# Patient Record
Sex: Male | Born: 1965 | Hispanic: No | Marital: Married | Smoking: Former smoker
Health system: Southern US, Community
[De-identification: ages and names within clinical notes are randomized; demographics above are authoritative.]

## PROBLEM LIST (undated history)

## (undated) DIAGNOSIS — J302 Other seasonal allergic rhinitis: Secondary | ICD-10-CM

## (undated) HISTORY — PX: ETHMOIDECTOMY: SHX5197

## (undated) HISTORY — DX: Other seasonal allergic rhinitis: J30.2

---

## 2010-03-07 ENCOUNTER — Ambulatory Visit: Payer: Self-pay | Admitting: Family Medicine

## 2010-09-05 NOTE — Assessment & Plan Note (Signed)
Summary: BACK PAIN/WB  History of Present Illness History of Present Illness:  Subjective:  Patient was lifting/carrying a heavy piece of furniture 4 days ago when he slipped and lost control of the piece.  He did not fall.  He felt sudden pain in his mid-back and left shoulder blade area.  He has also had vague discomfort in the anterior thighs of both legs.  His pain is worse with movement and awakens him at night.  No radicular symptoms.  No bowel or bladder dysfunction.  He has a history of occasional lower back spasm that resolves spontaneously.     Objective:  Appearance:  Patient appears healthy, stated age, and in no acute distress  Neck:  Supple.  No adenopathy is present.  Mild tenderness over trapezius muscles. Lungs:  Clear to auscultation.  Breath sounds are equal.  Upper back:  Tenderness over medial edge of left scapula Lower Back:  Full range of motion.   Tenderness in bilateral paraspinous muscles from L4 to Sacral area.  Straight leg raising test is negative.  Sitting knee extension test is negative.  Strength and sensation in the lower extremities is normal.  Patellar and achilles reflexes are normal.  Heart:  Regular rate and rhythm without murmurs, rubs, or gallops.  Abdomen:  Nontender without masses or hepatosplenomegaly.  Bowel sounds are present.  No CVA or flank tenderness.  Lower extremities:  No tenderness to palpation Assessment New Problems: BACK PAIN, THORACIC REGION, LEFT (ICD-724.1) LOW BACK PAIN, ACUTE (ICD-724.2)  SUSPECT RHOMBOID STRAIN, LOW BACK STRAIN.  Plan New Medications/Changes: TRAMADOL HCL 50 MG TABS (TRAMADOL HCL) One or two tabs by mouth hs as needed for pain  #20 x 0, 03/07/2010, Donna Christen MD NAPROXEN 500 MG TABS (NAPROXEN) One by mouth two times a day pc  #20 x 0, 03/07/2010, Donna Christen MD  New Orders: New Patient Level III [99203] Ketorolac-Toradol 15mg  [P3295] Planning Comments:   Toradol 60mg  IM Begin Naproxen.  Tramadol at  bedtime as needed.  Apply ice packs.  Begin exercises ([employer] information and instruction patient handout given)  Follow-up with PCP if not improving 7 to 10 days.   The patient and/or caregiver has been counseled thoroughly with regard to medications prescribed including dosage, schedule, interactions, rationale for use, and possible side effects and they verbalize understanding.  Diagnoses and expected course of recovery discussed and will return if not improved as expected or if the condition worsens. Patient and/or caregiver verbalized understanding.  Prescriptions: TRAMADOL HCL 50 MG TABS (TRAMADOL HCL) One or two tabs by mouth hs as needed for pain  #20 x 0   Entered and Authorized by:   Donna Christen MD   Signed by:   Donna Christen MD on 03/07/2010   Method used:   Telephoned to ...       [address] [phone]* (retail)       [address] [place], [place]  [address]       Ph: [phone]       Fax: [phone]   RxID:   [id number] NAPROXEN 500 MG TABS (NAPROXEN) One by mouth two times a day pc  #20 x 0   Entered and Authorized by:   Donna Christen MD   Signed by:   Donna Christen MD on 03/07/2010   Method used:   Telephoned to ...       [facility] [address] [phone]* (retail)  [address], [place]  [address]       Ph: [phone]       Fax: [phone]   RxID:   [id number]   Orders Added: 1)  New Patient Level III [84132] 2)  Ketorolac-Toradol 15mg  [G4010]

## 2010-10-27 ENCOUNTER — Encounter: Payer: Self-pay | Admitting: Family Medicine

## 2010-10-27 ENCOUNTER — Inpatient Hospital Stay (INDEPENDENT_AMBULATORY_CARE_PROVIDER_SITE_OTHER)
Admission: RE | Admit: 2010-10-27 | Discharge: 2010-10-27 | Disposition: A | Discharge status: Home / Self Care | Payer: Self-pay | Source: Ambulatory Visit | Attending: Family Medicine | Admitting: Family Medicine

## 2010-10-27 ENCOUNTER — Ambulatory Visit
Admission: RE | Admit: 2010-10-27 | Discharge: 2010-10-27 | Disposition: A | Discharge status: Home / Self Care | Payer: Self-pay | Source: Ambulatory Visit | Attending: Family Medicine | Admitting: Family Medicine

## 2010-10-27 ENCOUNTER — Other Ambulatory Visit: Payer: Self-pay | Admitting: Family Medicine

## 2010-10-27 DIAGNOSIS — S336XXA Sprain of sacroiliac joint, initial encounter: Secondary | ICD-10-CM

## 2010-10-27 DIAGNOSIS — M2170 Unequal limb length (acquired), unspecified site: Secondary | ICD-10-CM

## 2010-10-27 DIAGNOSIS — M545 Low back pain: Secondary | ICD-10-CM

## 2010-10-27 DIAGNOSIS — M546 Pain in thoracic spine: Secondary | ICD-10-CM

## 2010-11-07 NOTE — Assessment & Plan Note (Signed)
Summary: BACK PAIN/TJ Rm 4   Vital Signs:  Patient Profile:   45 Years Old Male CC:      Lower back pain and hip x 3 weeks Height:     72 inches Weight:      190 pounds O2 Sat:      95 % O2 treatment:    Room Air Temp:     99.4 degrees F oral Pulse rate:   85 / minute Pulse rhythm:   regular Resp:     16 per minute BP sitting:   109 / 77  (left arm) Cuff size:   large  Vitals Entered By: Emilio Math (October 27, 2010 6:59 PM)                History of Present Illness Chief Complaint: Lower back pain and hip x 3 weeks History of Present Illness:  Subjective:  Patient complains of gradual development of pain in his left thigh area, worse with walking, climbing stairs.  The pain does not radiate.  He recalls no trauma, but he does practice volleyball regularly with his daughter who competes in volleyball.  No bowel/bladder dysfunction.  No saddle numbness.  No weight loss.  No fevers, chills, and sweats.  He feels well otherwise.   No rash.  REVIEW OF SYSTEMS Constitutional Symptoms      Denies fever, chills, night sweats, weight loss, weight gain, and fatigue.  Eyes       Denies change in vision, eye pain, eye discharge, glasses, contact lenses, and eye surgery. Ear/Nose/Throat/Mouth       Denies hearing loss/aids, change in hearing, ear pain, ear discharge, dizziness, frequent runny nose, frequent nose bleeds, sinus problems, sore throat, hoarseness, and tooth pain or bleeding.  Respiratory       Denies dry cough, productive cough, wheezing, shortness of breath, asthma, bronchitis, and emphysema/COPD.  Cardiovascular       Denies murmurs, chest pain, and tires easily with exhertion.    Gastrointestinal       Denies stomach pain, nausea/vomiting, diarrhea, constipation, blood in bowel movements, and indigestion. Genitourniary       Denies painful urination, kidney stones, and loss of urinary control. Neurological       Denies paralysis, seizures, and  fainting/blackouts. Musculoskeletal       Complains of joint stiffness and decreased range of motion.      Denies muscle pain, joint pain, redness, swelling, muscle weakness, and gout.  Skin       Denies bruising, unusual mles/lumps or sores, and hair/skin or nail changes.  Psych       Denies mood changes, temper/anger issues, anxiety/stress, speech problems, depression, and sleep problems.  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  Extraocular movement is intact.  Conjunctivae are not inflamed.  Neck:  Supple.  No adenopathy is present.  No tenderness Lungs:  Clear to auscultation.  Breath sounds are equal.  Heart:  Regular rate and rhythm without murmurs, rubs, or gallops.  Abdomen:  Nontender without masses or hepatosplenomegaly.  Bowel sounds are present.  No CVA or flank tenderness.   Back:    Can heel/toe walk and squat  without difficulty.  Relatively good range of motion.  There is tenderness over both SI joints, worse on the left.   Straight leg raising test is negative.  Sitting knee extension test is negative.  Strength and sensation in the lower extremities is normal.  Patellar and achilles reflexes are normal.  Skin:  No rash Extremities:  No edema.  The right leg is approximately 1.5cm to 2cm longer than the left leg by inspection X-ray LS spine:   IMPRESSION: Negative exam.    Assessment New Problems: UNEQUAL LEG LENGTH (ICD-736.81) SACROILIAC STRAIN, ACUTE (ICD-846.9) LOW BACK PAIN, ACUTE (ICD-724.2)  SUSPECT SI JOINT INFLAMMATION AS A RESULT OF LEG LENGTH DISCREPANCY  Plan New Medications/Changes: LORTAB 5 5-500 MG TABS  (HYDROCODONE-ACETAMINOPHEN) One or two tabs by mouth hs as needed pain  #12 (twelve) x 0, 10/27/2010, Donna Christen MD NAPROXEN 500 MG TABS (NAPROXEN) One by mouth two times a day pc  #20 x 1, 10/27/2010, Donna Christen MD  New Orders: T-DG Lumbar Spine Complete 5 views [72110] Est. Patient Level IV [81191] Services provided After hours-Weekends-Holidays [99051] Planning Comments:   Begin Naproxen.  Apply ice pack several times daily.  Analgesic for bedtime.  Recommend obtaining shoe insert for left shoe. Begin stretching exercises for SI joints ([employer] information and instruction patient handout given)  Followup with [facility] if not improved in two weeks.  May need custom orthotic shoe insert   The patient and/or caregiver has been counseled thoroughly with regard to medications prescribed including dosage, schedule, interactions, rationale for use, and possible side effects and they verbalize understanding.  Diagnoses and expected course of recovery discussed and will return if not improved as expected or if the condition worsens. Patient and/or caregiver verbalized understanding.  Prescriptions: LORTAB 5 5-500 MG TABS (HYDROCODONE-ACETAMINOPHEN) One or two tabs by mouth hs as needed pain  #12 (twelve) x 0   Entered and Authorized by:   Donna Christen MD   Signed by:   Donna Christen MD on 10/27/2010   Method used:   Print then Give to Patient   RxID:   [id number] NAPROXEN 500 MG TABS (NAPROXEN) One by mouth two times a day pc  #20 x 1   Entered and Authorized by:   Donna Christen MD   Signed by:   Donna Christen MD on 10/27/2010   Method used:   Print then Give to Patient   RxID:   [id number]   Orders Added: 1)  T-DG Lumbar Spine Complete 5 views [72110] 2)  Est. Patient Level IV [10272] 3)  Services provided After hours-Weekends-Holidays [99051]

## 2011-09-20 ENCOUNTER — Emergency Department
Admission: EM | Admit: 2011-09-20 | Discharge: 2011-09-20 | Disposition: A | Discharge status: Home / Self Care | Payer: BC Managed Care – PPO | Source: Home / Self Care | Attending: Family Medicine | Admitting: Family Medicine

## 2011-09-20 ENCOUNTER — Encounter: Payer: Self-pay | Admitting: Emergency Medicine

## 2011-09-20 ENCOUNTER — Emergency Department
Admit: 2011-09-20 | Discharge: 2011-09-20 | Disposition: A | Discharge status: Home / Self Care | Payer: BC Managed Care – PPO | Attending: Family Medicine | Admitting: Family Medicine

## 2011-09-20 DIAGNOSIS — R079 Chest pain, unspecified: Secondary | ICD-10-CM

## 2011-09-20 DIAGNOSIS — R0781 Pleurodynia: Secondary | ICD-10-CM

## 2011-09-20 DIAGNOSIS — J9819 Other pulmonary collapse: Secondary | ICD-10-CM

## 2011-09-20 DIAGNOSIS — J9811 Atelectasis: Secondary | ICD-10-CM

## 2011-09-20 LAB — POCT CBC W AUTO DIFF (K'VILLE URGENT CARE)

## 2011-09-20 MED ORDER — PREDNISONE 10 MG PO TABS: ORAL_TABLET | ORAL | Status: DC

## 2011-09-20 MED ORDER — HYDROCODONE-ACETAMINOPHEN 5-500 MG PO TABS: 1.0000 | ORAL_TABLET | Freq: Every evening | ORAL | Status: AC | PRN

## 2011-09-20 MED ORDER — DOXYCYCLINE HYCLATE 100 MG PO TABS: 100.0000 mg | ORAL_TABLET | Freq: Two times a day (BID) | ORAL | Status: AC

## 2011-09-20 MED ORDER — CYCLOBENZAPRINE HCL 10 MG PO TABS: ORAL_TABLET | ORAL | Status: DC

## 2011-09-20 NOTE — ED Notes (Signed)
Left rib pain radiates to left shoulder x 5 days, pain is sharp in ribs, achy in shoulder

## 2011-09-20 NOTE — ED Provider Notes (Signed)
History     CSN: [id number]  Arrival date & time 09/20/11  2130   First MD Initiated Contact with Patient 09/20/11 1009      Chief Complaint  Patient presents with  . Chest Pain    (Consider location/radiation/quality/duration/timing/severity/associated sxs/prior treatment) HPI Comments: Patient states that he was sitting on couch watching TV four days ago when he developed sudden sharp pain in his left anterior inferior rib.  The pain has persisted and is worse with movement, deep inspiration, cough. He has had difficulty sleeping at night as a result.  He is now having some dull mild radiation of pain to his left neck and shoulder.  No shortness of breath but feels restriction in chest with deep inspiration.  No fevers, chills, and sweats.  No recent cough or URI.  Nonsmoker.  Patient is a 46 y.o. male presenting with chest pain. The history is provided by the patient.  Chest Pain The chest pain began 3 - 5 days ago. Chest pain occurs frequently. The chest pain is worsening. The pain is associated with breathing, coughing and lifting (movement). The severity of the pain is severe. The quality of the pain is described as sharp. The pain radiates to the left neck and left shoulder. Chest pain is worsened by certain positions and deep breathing. Pertinent negatives for primary symptoms include no shortness of breath, no cough and no wheezing. He tried nothing for the symptoms.     History reviewed. No pertinent past medical history.  Past surgical history:  none  Family history Mother:  COPD  History  Substance Use Topics  . Smoking status: Not presently; past history of smoking  . Smokeless tobacco: No  . Alcohol Use: No      Review of Systems  Constitutional: Negative.   HENT: Negative.   Eyes: Negative.   Respiratory: Positive for chest tightness. Negative for cough, shortness of breath, wheezing and stridor.   Cardiovascular: Positive for chest pain.  Gastrointestinal:  Negative.   Genitourinary: Negative.   Musculoskeletal: Negative.   Skin: Negative.   Neurological: Negative.     Allergies  Review of patient's allergies indicates no known allergies.  Home Medications   Current Outpatient Rx  Name Route Sig Dispense Refill  . ESOMEPRAZOLE MAGNESIUM 40 MG PO CPDR Oral Take 40 mg by mouth daily before breakfast.    . CYCLOBENZAPRINE HCL 10 MG PO TABS  Take one tab by mouth, one to three times daily as necessary for muscle spasm 20 tablet 0  . DOXYCYCLINE HYCLATE 100 MG PO TABS Oral Take 1 tablet (100 mg total) by mouth 2 (two) times daily. 14 tablet 0  . HYDROCODONE-ACETAMINOPHEN 5-500 MG PO TABS Oral Take 1 tablet by mouth at bedtime as needed for pain. 10 tablet 0  . PREDNISONE 10 MG PO TABS  Take 2 tabs by mouth twice daily for three days, then one tab twice daily for 2 days, then 1 daily for two days.  Take PC 18 tablet 0    BP 153/118  Pulse 96  Temp(Src) 98.8 F (37.1 C) (Oral)  Resp 18  Ht 6' (1.829 m)  Wt 220 lb (99.791 kg)  BMI 29.84 kg/m2  SpO2 96%  Physical Exam  Nursing note and vitals reviewed. Constitutional: He is oriented to person, place, and time. He appears well-developed and well-nourished. No distress.  HENT:  Head: Normocephalic and atraumatic.  Mouth/Throat: Oropharynx is clear and moist.  Eyes: Conjunctivae are normal. Pupils are equal, round,  and reactive to light.  Neck: Normal range of motion. Neck supple.  Cardiovascular: Normal rate, regular rhythm and normal heart sounds.   Pulmonary/Chest: Effort normal and breath sounds normal. No respiratory distress. He has no wheezes. He has no rales. He exhibits tenderness.  Abdominal: Soft. Bowel sounds are normal. There is no tenderness.  Musculoskeletal: He exhibits no edema and no tenderness.       Arms:      There is distinct point tenderness to palpation over left anterior inferior rib at costal margin.  No ecchymosis, swelling, or crepitance.  Lymphadenopathy:     He has no cervical adenopathy.  Neurological: He is alert and oriented to person, place, and time.  Skin: Skin is warm and dry. No rash noted.    ED Course  Procedures  none  Labs Reviewed - No data to display Dg Chest 2 View  09/20/2011  *RADIOLOGY REPORT*  Clinical Data: Pleuritic left chest pain for 4 days  CHEST - 2 VIEW  Comparison: None.  Findings: There is mild bibasilar linear atelectasis present.  The heart is within upper limits of normal.  No focal infiltrate or effusion is seen.  Mediastinal contours appear normal.  No acute bony abnormality is noted.  IMPRESSION: Mild bibasilar linear atelectasis.  Original Report Authenticated By: Juline Patch, M.D.   Dg Ribs Unilateral Left  09/20/2011  *RADIOLOGY REPORT*  Clinical Data: Pleuritic left chest pain for 4 days  LEFT RIBS - 2 VIEW  Comparison: None.  Findings: Left lower rib detail films were obtained with a small metallic marker placed over the area of pain.  No acute left rib fracture is seen.  IMPRESSION: Negative left rib detail.  Original Report Authenticated By: Juline Patch, M.D.   EKG within normal limits.  1. Rib pain on left side   2. Linear atelectasis       MDM  Begin tapering course of prednisone.  Empirically begin doxycyline for one week. Lortab at bedtime.  Suspect pain in left shoulder and neck is because of muscle splinting from chest pain:  Begin Flexeril. Apply heating pad to chest two or three times daily. Bibasilar linear atelectasis may be completely coincidental; however, will arrange pulmonologist referral in about a week.         Donna Christen, MD 09/20/11 1146

## 2011-09-20 NOTE — Discharge Instructions (Signed)
Apply heating pad to chest two or three times daily.

## 2011-09-27 ENCOUNTER — Ambulatory Visit (INDEPENDENT_AMBULATORY_CARE_PROVIDER_SITE_OTHER): Payer: BC Managed Care – PPO | Admitting: Internal Medicine

## 2011-09-27 ENCOUNTER — Other Ambulatory Visit: Payer: Self-pay | Admitting: Internal Medicine

## 2011-09-27 ENCOUNTER — Encounter: Payer: Self-pay | Admitting: Internal Medicine

## 2011-09-27 DIAGNOSIS — J9811 Atelectasis: Secondary | ICD-10-CM

## 2011-09-27 DIAGNOSIS — R0789 Other chest pain: Secondary | ICD-10-CM

## 2011-09-27 DIAGNOSIS — R071 Chest pain on breathing: Secondary | ICD-10-CM

## 2011-09-27 DIAGNOSIS — Z0000 Encounter for general adult medical examination without abnormal findings: Secondary | ICD-10-CM

## 2011-09-27 DIAGNOSIS — J9819 Other pulmonary collapse: Secondary | ICD-10-CM

## 2011-09-27 LAB — HEPATIC FUNCTION PANEL
ALT: 48 U/L (ref 0–53)
Albumin: 4.5 g/dL (ref 3.5–5.2)
Indirect Bilirubin: 0.5 mg/dL (ref 0.0–0.9)
Total Protein: 6.8 g/dL (ref 6.0–8.3)

## 2011-09-27 LAB — BASIC METABOLIC PANEL
CO2: 25 mEq/L (ref 19–32)
Chloride: 106 mEq/L (ref 96–112)
Creat: 0.77 mg/dL (ref 0.50–1.35)
Potassium: 4.7 mEq/L (ref 3.5–5.3)

## 2011-09-27 LAB — CBC
Hemoglobin: 16.2 g/dL (ref 13.0–17.0)
MCH: 30.5 pg (ref 26.0–34.0)
MCHC: 33.6 g/dL (ref 30.0–36.0)
MCV: 90.8 fL (ref 78.0–100.0)
RBC: 5.31 MIL/uL (ref 4.22–5.81)

## 2011-09-27 LAB — LIPID PANEL
Cholesterol: 171 mg/dL (ref 0–200)
HDL: 52 mg/dL (ref >39)
LDL Cholesterol: 106 mg/dL — ABNORMAL HIGH (ref 0–99)
Total CHOL/HDL Ratio: 3.3 Ratio
Triglycerides: 63 mg/dL (ref <150)
VLDL: 13 mg/dL (ref 0–40)

## 2011-09-27 MED ORDER — HYDROCODONE-ACETAMINOPHEN 5-500 MG PO TABS: 1.0000 | ORAL_TABLET | Freq: Three times a day (TID) | ORAL | Status: AC | PRN

## 2011-09-27 NOTE — Progress Notes (Signed)
  Subjective:    Patient ID: Antonio Calhoun, male    DOB: 12/28/1965, 46 y.o.   MRN: [id number]  HPI Pt presents to clinic for follow up of abnormal cxr. Recently seen at [facility] with left ant CW pain worsened by movement and was reproducible to palpation. No preceeding injury or trauma. cxr was obtained with finding of mild bibasilar linear atelectasis without rib or other abnormality. Has had no cough, fever, chills or recent illness. Placed on course of doxycycline x 7d, prednisone, hydrocodone and flexeril. Feels 50% better. Was told needed pulmonary consult for atelectasis. States at the time was not taking deep breaths due to pain. BP mildly elevated with h/o HTN in the past which he believes was more work stress related. No other alleviating or exacerbating factors. No other complaints.   Past Medical History  Diagnosis Date  . Asthma     childhood  . Seasonal allergies    Past Surgical History  Procedure Date  . Ethmoidectomy     age 41    reports that he has quit smoking. He has never used smokeless tobacco. He reports that he drinks alcohol. He reports that he does not use illicit drugs. family history includes Alcohol abuse in his father; Arthritis in his paternal grandfather; Heart attack in his father; and Stroke in his paternal grandfather.  There is no history of Breast cancer, and Colon polyps, and Heart disease, and Colon cancer, and Hypertension, and Hyperlipidemia, . No Known Allergies   Review of Systems  Constitutional: Negative for fever, chills and fatigue.  Respiratory: Negative for cough, shortness of breath and wheezing.   Cardiovascular: Negative for chest pain and palpitations.  All other systems reviewed and are negative.       Objective:   Physical Exam  Physical Exam  Nursing note and vitals reviewed. Constitutional: Appears well-developed and well-nourished. No distress.  HENT:  Head: Normocephalic and atraumatic.  Right Ear: External ear normal.  Left  Ear: External ear normal.  Eyes: Conjunctivae are normal. No scleral icterus.  Neck: Neck supple. Carotid bruit is not present.  Cardiovascular: Normal rate, regular rhythm and normal heart sounds.  Exam reveals no gallop and no friction rub.   No murmur heard. Pulmonary/Chest: Effort normal and breath sounds normal. No respiratory distress. He has no wheezes. no rales.  Lymphadenopathy:    He has no cervical adenopathy.  Neurological:Alert.  Skin: Skin is warm and dry. Not diaphoretic.  Psychiatric: Has a normal mood and affect.        Assessment & Plan:

## 2011-09-28 LAB — HEMOGLOBIN A1C: Hgb A1c MFr Bld: 5.6 % (ref <5.7)

## 2011-09-30 ENCOUNTER — Encounter: Payer: Self-pay | Admitting: Internal Medicine

## 2011-09-30 DIAGNOSIS — R0789 Other chest pain: Secondary | ICD-10-CM | POA: Insufficient documentation

## 2011-09-30 DIAGNOSIS — J9811 Atelectasis: Secondary | ICD-10-CM | POA: Insufficient documentation

## 2011-09-30 NOTE — Assessment & Plan Note (Signed)
Reviewed with pt definition of atelectasis. No clinical evidence to support pneumonia. Schedule 2wk f/u and consider repeat cxr. May want cpe at that time per pt

## 2011-09-30 NOTE — Assessment & Plan Note (Signed)
Clinically c/w msk etiology. rf vicodin for prn use. Use nsaid after prednisone completion. Close f/u scheduled.

## 2011-10-10 ENCOUNTER — Encounter: Payer: Self-pay | Admitting: Internal Medicine

## 2011-10-10 ENCOUNTER — Ambulatory Visit (INDEPENDENT_AMBULATORY_CARE_PROVIDER_SITE_OTHER): Payer: BC Managed Care – PPO | Admitting: Internal Medicine

## 2011-10-10 VITALS — BP 124/90 | HR 85 | Temp 97.5°F | Resp 18 | Ht 70.75 in | Wt 221.0 lb

## 2011-10-10 DIAGNOSIS — IMO0001 Reserved for inherently not codable concepts without codable children: Secondary | ICD-10-CM

## 2011-10-10 DIAGNOSIS — Z0000 Encounter for general adult medical examination without abnormal findings: Secondary | ICD-10-CM

## 2011-10-10 DIAGNOSIS — Z125 Encounter for screening for malignant neoplasm of prostate: Secondary | ICD-10-CM

## 2011-10-10 DIAGNOSIS — J9819 Other pulmonary collapse: Secondary | ICD-10-CM

## 2011-10-10 DIAGNOSIS — R5381 Other malaise: Secondary | ICD-10-CM

## 2011-10-10 DIAGNOSIS — J9811 Atelectasis: Secondary | ICD-10-CM

## 2011-10-10 DIAGNOSIS — R030 Elevated blood-pressure reading, without diagnosis of hypertension: Secondary | ICD-10-CM

## 2011-10-10 DIAGNOSIS — R5383 Other fatigue: Secondary | ICD-10-CM

## 2011-10-10 DIAGNOSIS — D72829 Elevated white blood cell count, unspecified: Secondary | ICD-10-CM

## 2011-10-10 NOTE — Patient Instructions (Signed)
Please schedule chest xray and labs for this Friday morning Cbc-leukocytosis, psa-prostate cancer screening, testosterone-fatigue

## 2011-10-10 NOTE — Assessment & Plan Note (Signed)
Asx. Obtain f/u cxr

## 2011-10-10 NOTE — Assessment & Plan Note (Signed)
Asx. Pursue low sodium diet and regular cardio exercise. Monitor bp as outpt and f/u in clinic as scheduled.

## 2011-10-10 NOTE — Progress Notes (Signed)
  Subjective:    Patient ID: Antonio Calhoun, male    DOB: [birth year], 46 y.o.   MRN: [id number]  HPI Pt presents to clinic for annual exam. Recent left ant chest wall pain has resolved. Reviewed h/o mildly elevated bp. C/o fatigue and decreased libido.  Past Medical History  Diagnosis Date  . Asthma     childhood  . Seasonal allergies    Past Surgical History  Procedure Date  . Ethmoidectomy     age 89    reports that he has quit smoking. He has never used smokeless tobacco. He reports that he drinks alcohol. He reports that he does not use illicit drugs. family history includes Alcohol abuse in his father; Arthritis in his paternal grandfather; Heart attack in his father; and Stroke in his paternal grandfather.  There is no history of Breast cancer, and Colon polyps, and Heart disease, and Colon cancer, and Hypertension, and Hyperlipidemia, . No Known Allergies   Review of Systems see hpi     Objective:   Physical Exam  Physical Exam  Nursing note and vitals reviewed. Constitutional: He appears well-developed and well-nourished. No distress.  HENT:  Head: Normocephalic and atraumatic.  Right Ear: Tympanic membrane and external ear normal.  Left Ear: Tympanic membrane and external ear normal.  Nose: Nose normal.  Mouth/Throat: Uvula is midline, oropharynx is clear and moist and mucous membranes are normal. No oropharyngeal exudate.  Eyes: Conjunctivae and EOM are normal. Pupils are equal, round, and reactive to light. Right eye exhibits no discharge. Left eye exhibits no discharge. No scleral icterus.  Neck: Neck supple. Carotid bruit is not present. No thyromegaly present.  Cardiovascular: Normal rate, regular rhythm and normal heart sounds.  Exam reveals no gallop and no friction rub.   No murmur heard. Pulmonary/Chest: Effort normal and breath sounds normal. No respiratory distress. He has no wheezes. He has no rales.  Abdominal: Soft. He exhibits no distension and no mass.  There is no hepatosplenomegaly. There is no tenderness. There is no rebound. Hernia confirmed negative in the right inguinal area and confirmed negative in the left inguinal area.  Genitourinary:Right testis shows no mass, no swelling and no tenderness. Right testis is descended. Left testis shows no mass, no swelling and no tenderness. Left testis is descended.  Lymphadenopathy:    He has no cervical adenopathy.       Right: No inguinal adenopathy present.       Left: No inguinal adenopathy present.  Neurological: He is alert.  Skin: Skin is warm and dry. He is not diaphoretic.  Psychiatric: He has a normal mood and affect.        Assessment & Plan:

## 2011-10-10 NOTE — Assessment & Plan Note (Signed)
Nl exam. Given c/o fatige and decreased libido obtain early am testosterone. Repeat cbc due to mild asx leukocytosis

## 2011-10-12 ENCOUNTER — Ambulatory Visit (HOSPITAL_BASED_OUTPATIENT_CLINIC_OR_DEPARTMENT_OTHER)
Admission: RE | Admit: 2011-10-12 | Discharge: 2011-10-12 | Disposition: A | Discharge status: Home / Self Care | Payer: BC Managed Care – PPO | Source: Ambulatory Visit | Attending: Internal Medicine | Admitting: Internal Medicine

## 2011-10-12 ENCOUNTER — Other Ambulatory Visit: Payer: Self-pay | Admitting: *Deleted

## 2011-10-12 DIAGNOSIS — J9811 Atelectasis: Secondary | ICD-10-CM

## 2011-10-12 DIAGNOSIS — D72829 Elevated white blood cell count, unspecified: Secondary | ICD-10-CM

## 2011-10-12 DIAGNOSIS — R5383 Other fatigue: Secondary | ICD-10-CM

## 2011-10-12 DIAGNOSIS — Z09 Encounter for follow-up examination after completed treatment for conditions other than malignant neoplasm: Secondary | ICD-10-CM | POA: Insufficient documentation

## 2011-10-12 DIAGNOSIS — Z125 Encounter for screening for malignant neoplasm of prostate: Secondary | ICD-10-CM

## 2011-10-12 DIAGNOSIS — J9819 Other pulmonary collapse: Secondary | ICD-10-CM

## 2011-10-12 LAB — CBC
Hemoglobin: 15.9 g/dL (ref 13.0–17.0)
MCH: 30.5 pg (ref 26.0–34.0)
Platelets: 312 10*3/uL (ref 150–400)
RBC: 5.21 MIL/uL (ref 4.22–5.81)
WBC: 6.2 10*3/uL (ref 4.0–10.5)

## 2011-10-13 LAB — PSA: PSA: 1.21 ng/mL (ref <=4.00)

## 2011-10-13 LAB — TESTOSTERONE: Testosterone: 248.5 ng/dL — ABNORMAL LOW (ref 250–890)

## 2011-10-15 ENCOUNTER — Other Ambulatory Visit: Payer: Self-pay | Admitting: Internal Medicine

## 2011-10-15 DIAGNOSIS — E291 Testicular hypofunction: Secondary | ICD-10-CM

## 2011-10-17 LAB — TESTOSTERONE, FREE, TOTAL, SHBG
Sex Hormone Binding: 22 nmol/L (ref 13–71)
Testosterone, Free: 53 pg/mL (ref 47.0–244.0)

## 2012-01-09 ENCOUNTER — Ambulatory Visit (INDEPENDENT_AMBULATORY_CARE_PROVIDER_SITE_OTHER): Payer: BC Managed Care – PPO | Admitting: Internal Medicine

## 2012-01-09 ENCOUNTER — Encounter: Payer: Self-pay | Admitting: Internal Medicine

## 2012-01-09 VITALS — BP 120/100 | HR 77 | Temp 98.3°F | Resp 18 | Ht 70.75 in | Wt 215.0 lb

## 2012-01-09 DIAGNOSIS — G4700 Insomnia, unspecified: Secondary | ICD-10-CM | POA: Insufficient documentation

## 2012-01-09 DIAGNOSIS — K219 Gastro-esophageal reflux disease without esophagitis: Secondary | ICD-10-CM

## 2012-01-09 DIAGNOSIS — R030 Elevated blood-pressure reading, without diagnosis of hypertension: Secondary | ICD-10-CM

## 2012-01-09 MED ORDER — PANTOPRAZOLE SODIUM 40 MG PO TBEC: 40.0000 mg | DELAYED_RELEASE_TABLET | Freq: Every day | ORAL | Status: DC

## 2012-01-09 MED ORDER — ZOLPIDEM TARTRATE 5 MG PO TABS: 5.0000 mg | ORAL_TABLET | Freq: Every evening | ORAL | Status: DC | PRN

## 2012-01-09 NOTE — Assessment & Plan Note (Signed)
Failed omeprazole. Attempt protonix
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
## 2012-01-09 NOTE — Assessment & Plan Note (Signed)
Possible contribution from stress. Recommend 2-3 wk outpt bp log to be submitted for review. S/p sodium restriction and exercise.

## 2012-01-09 NOTE — Assessment & Plan Note (Signed)
Short term use of ambien. Has paradoxical effect from [facility].

## 2012-05-20 ENCOUNTER — Other Ambulatory Visit: Payer: Self-pay | Admitting: Internal Medicine

## 2012-05-21 NOTE — Telephone Encounter (Signed)
Done/SLS 

## 2012-09-20 ENCOUNTER — Other Ambulatory Visit: Payer: Self-pay

## 2012-09-30 ENCOUNTER — Other Ambulatory Visit: Payer: Self-pay | Admitting: Internal Medicine

## 2013-01-19 ENCOUNTER — Telehealth: Payer: Self-pay | Admitting: Internal Medicine

## 2013-01-19 MED ORDER — PANTOPRAZOLE SODIUM 40 MG PO TBEC: 40.0000 mg | DELAYED_RELEASE_TABLET | Freq: Every day | ORAL | Status: DC

## 2013-01-19 NOTE — Addendum Note (Signed)
Addended by: Court Joy on: 01/19/2013 03:19 PM   Modules accepted: Orders

## 2013-01-19 NOTE — Telephone Encounter (Signed)
pantoprazole sod dr 40 mg tab qty 30 take 1 tablet 40 mg by mouth daily last fill 12-26-2012

## 2013-01-19 NOTE — Telephone Encounter (Signed)
Pt informed that I could only give 30 tabs.   Pt scheduled a physical appt for 02-16-13

## 2013-01-19 NOTE — Telephone Encounter (Signed)
RX sent

## 2013-02-16 ENCOUNTER — Ambulatory Visit (INDEPENDENT_AMBULATORY_CARE_PROVIDER_SITE_OTHER): Payer: BC Managed Care – PPO | Admitting: Family Medicine

## 2013-02-16 DIAGNOSIS — R030 Elevated blood-pressure reading, without diagnosis of hypertension: Secondary | ICD-10-CM

## 2013-02-16 DIAGNOSIS — Z0289 Encounter for other administrative examinations: Secondary | ICD-10-CM

## 2013-02-16 NOTE — Progress Notes (Signed)
Patient ID: Antonio Calhoun, male   DOB: 09/20/1965, 46 y.o.   MRN: [id number] Patient did not show for visit

## 2013-03-16 ENCOUNTER — Other Ambulatory Visit: Payer: Self-pay | Admitting: *Deleted

## 2013-03-16 MED ORDER — PANTOPRAZOLE SODIUM 40 MG PO TBEC: 40.0000 mg | DELAYED_RELEASE_TABLET | Freq: Every day | ORAL | Status: DC

## 2013-03-16 NOTE — Telephone Encounter (Signed)
Rx request to pharmacy/SLS  

## 2013-05-05 IMAGING — CR DG RIBS 2V*L*
2 series · 2 of 2 positions shown · non-contrast
Comparison: None.

CLINICAL DATA: Pleuritic left chest pain for 4 days

LEFT RIBS - 2 VIEW

[view not recorded (1 of 2)]
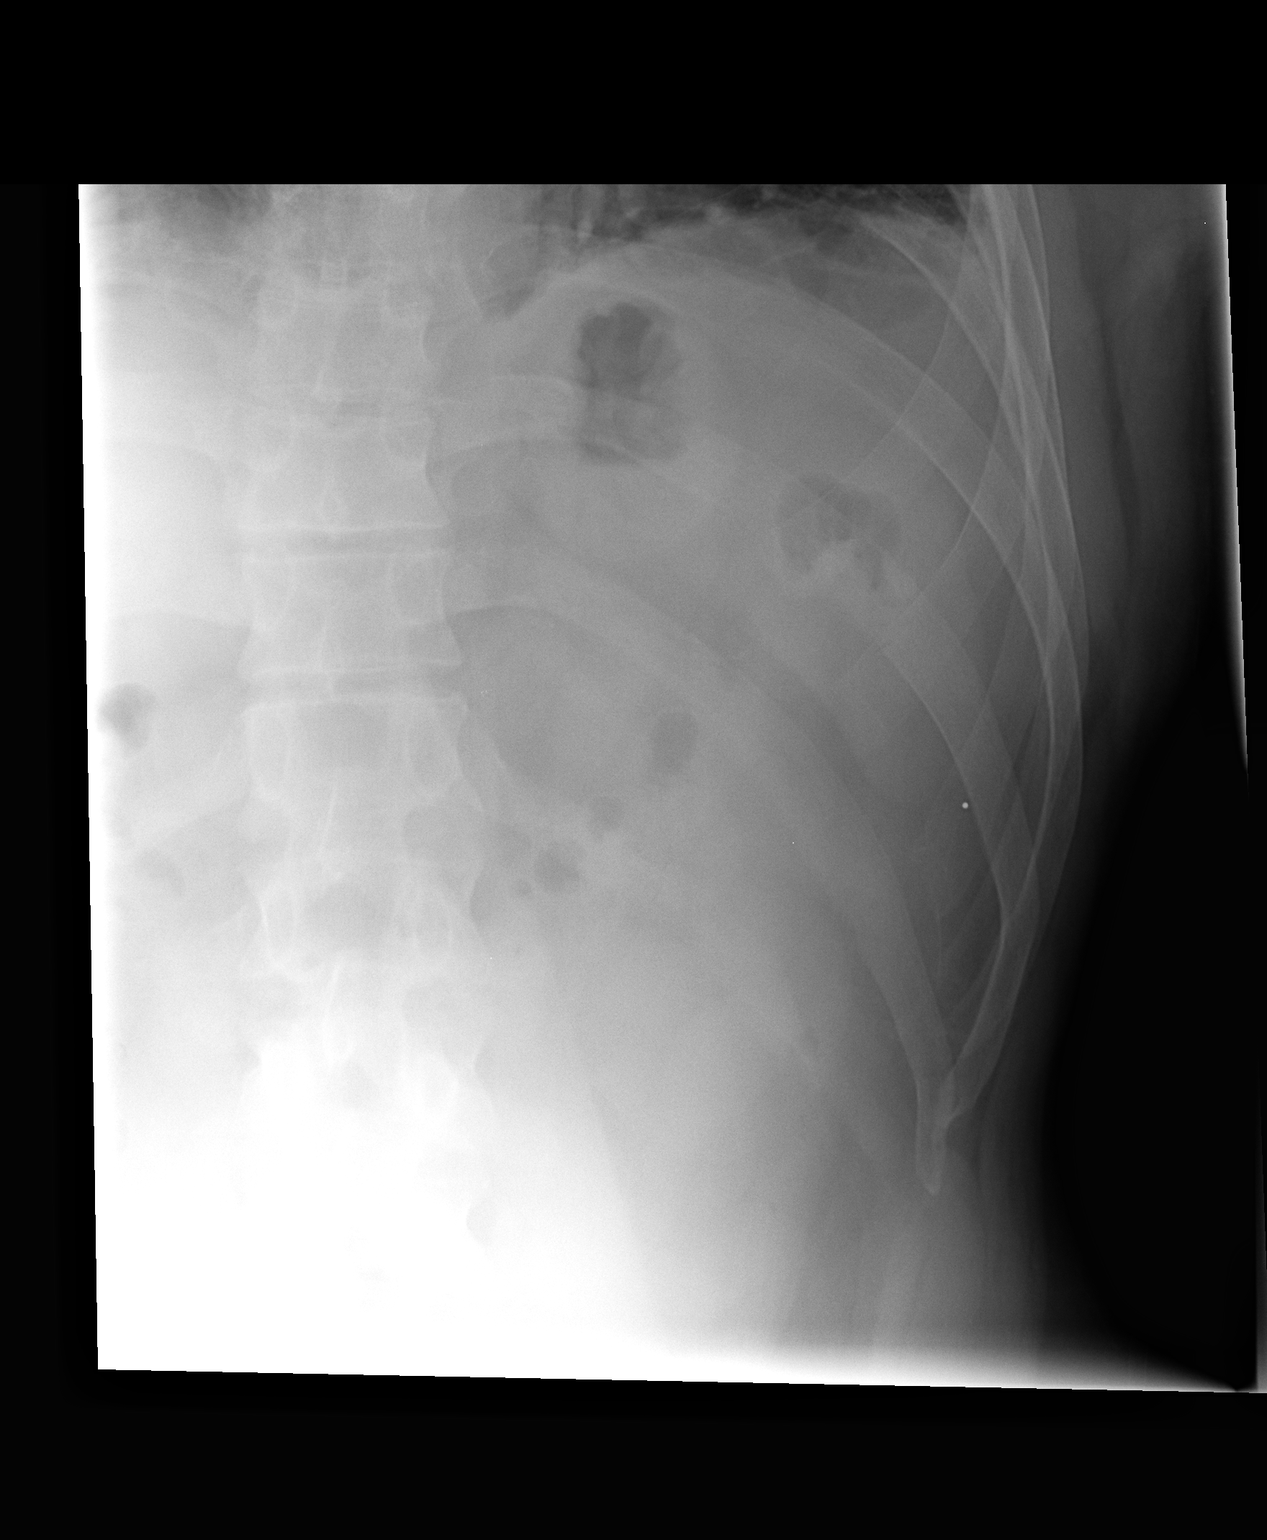

[view not recorded (2 of 2)]
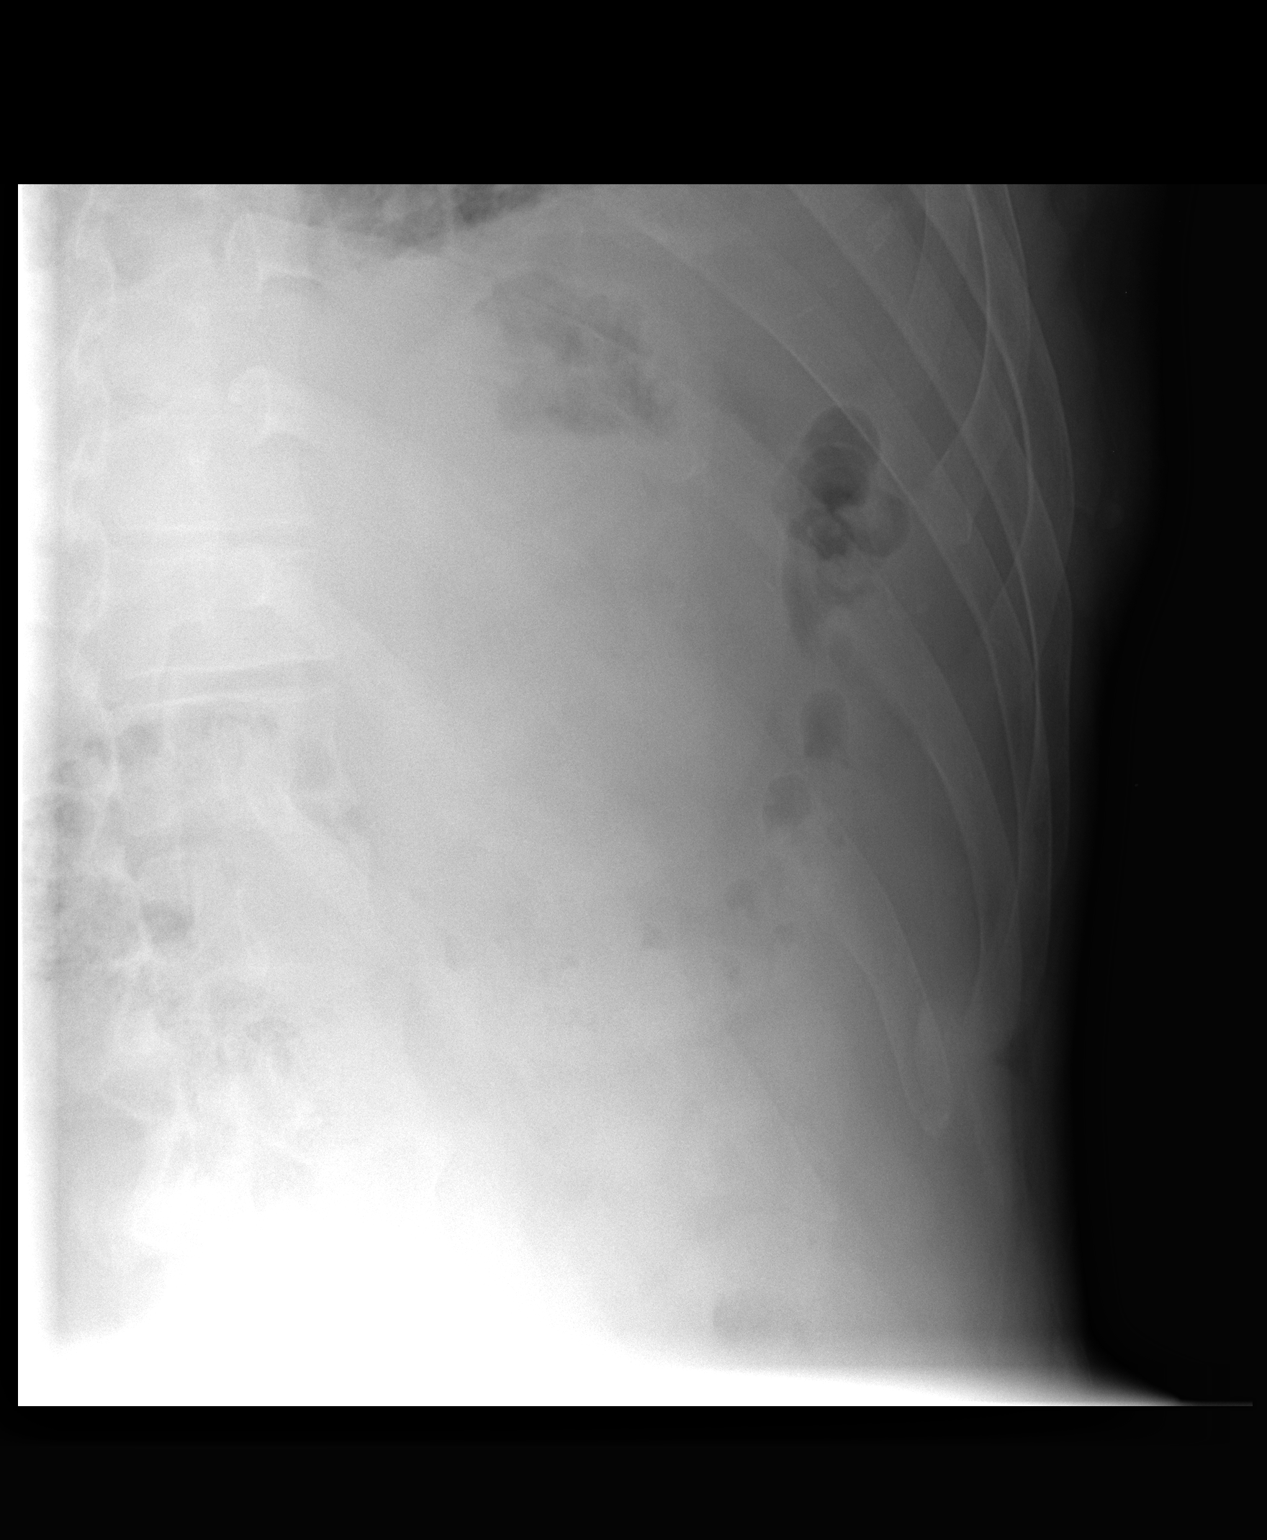

[2 of 2 positions shown; findings below may reference images not displayed]

FINDINGS: Left lower rib detail films were obtained with a small
metallic marker placed over the area of pain.  No acute left rib
fracture is seen.
IMPRESSION: Negative left rib detail.

## 2013-06-11 ENCOUNTER — Other Ambulatory Visit: Payer: Self-pay

## 2014-12-26 ENCOUNTER — Encounter: Payer: Self-pay | Admitting: Emergency Medicine

## 2014-12-26 ENCOUNTER — Emergency Department (INDEPENDENT_AMBULATORY_CARE_PROVIDER_SITE_OTHER)
Admission: EM | Admit: 2014-12-26 | Discharge: 2014-12-26 | Disposition: A | Discharge status: Home / Self Care | Payer: BLUE CROSS/BLUE SHIELD | Source: Home / Self Care | Attending: Family Medicine | Admitting: Family Medicine

## 2014-12-26 DIAGNOSIS — L259 Unspecified contact dermatitis, unspecified cause: Secondary | ICD-10-CM | POA: Diagnosis not present

## 2014-12-26 DIAGNOSIS — Z23 Encounter for immunization: Secondary | ICD-10-CM | POA: Diagnosis not present

## 2014-12-26 DIAGNOSIS — R631 Polydipsia: Secondary | ICD-10-CM | POA: Diagnosis not present

## 2014-12-26 LAB — POCT CBC W AUTO DIFF (K'VILLE URGENT CARE)

## 2014-12-26 LAB — COMPLETE METABOLIC PANEL WITH GFR
ALBUMIN: 4.4 g/dL (ref 3.5–5.2)
ALT: 24 U/L (ref 0–53)
AST: 20 U/L (ref 0–37)
Alkaline Phosphatase: 61 U/L (ref 39–117)
BILIRUBIN TOTAL: 0.7 mg/dL (ref 0.2–1.2)
BUN: 23 mg/dL (ref 6–23)
CALCIUM: 9.5 mg/dL (ref 8.4–10.5)
CHLORIDE: 107 meq/L (ref 96–112)
CO2: 26 mEq/L (ref 19–32)
CREATININE: 1.04 mg/dL (ref 0.50–1.35)
GFR, Est African American: >89 mL/min
GFR, Est Non African American: 84 mL/min
Glucose, Bld: 64 mg/dL — ABNORMAL LOW (ref 70–99)
POTASSIUM: 4.6 meq/L (ref 3.5–5.3)
SODIUM: 138 meq/L (ref 135–145)
TOTAL PROTEIN: 6.6 g/dL (ref 6.0–8.3)

## 2014-12-26 LAB — POCT URINALYSIS DIPSTICK
Bilirubin, UA: NEGATIVE
Blood, UA: NEGATIVE
GLUCOSE UA: NEGATIVE
Ketones, UA: NEGATIVE
Leukocytes, UA: NEGATIVE
NITRITE UA: NEGATIVE
PROTEIN UA: NEGATIVE
UROBILINOGEN UA: 0.2 (ref 0–1)
pH, UA: 5 (ref 5–8)

## 2014-12-26 LAB — CK: CK TOTAL: 109 U/L (ref 7–232)

## 2014-12-26 MED ORDER — TRIAMCINOLONE ACETONIDE 40 MG/ML IJ SUSP
40.0000 mg | Freq: Once | INTRAMUSCULAR | Status: AC
  Administered 2014-12-26: 40 mg via INTRAMUSCULAR

## 2014-12-26 MED ORDER — TETANUS-DIPHTH-ACELL PERTUSSIS 5-2.5-18.5 LF-MCG/0.5 IM SUSP
0.5000 mL | Freq: Once | INTRAMUSCULAR | Status: AC
  Administered 2014-12-26: 0.5 mL via INTRAMUSCULAR

## 2014-12-26 MED ORDER — DOXYCYCLINE HYCLATE 100 MG PO CAPS: 100.0000 mg | ORAL_CAPSULE | Freq: Two times a day (BID) | ORAL | Status: DC

## 2014-12-26 NOTE — Discharge Instructions (Signed)

## 2014-12-26 NOTE — ED Provider Notes (Signed)
CSN: [id number]     Arrival date & time 12/26/14  1519 History   First MD Initiated Contact with Patient 12/26/14 1544     Chief Complaint  Patient presents with  . 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  The next day he noticed a pruritic rash on his lower legs that has persisted.  He states that he always drinks water regularly, but during the past six days he has had increased thirst that even awakens him.  He has also noticed a recurring sensation that his muscles are contracting even when he is relaxed (not muscle cramps or pain).  No fevers, chills, and sweats.  He feels well otherwise.  Patient is a 49 y.o. male presenting with rash. The history is provided by the patient.  Rash Location: lower legs. Quality: itchiness   Quality: not painful   Severity:  Mild Onset quality:  Sudden Duration:  6 days Timing:  Constant Progression:  Worsening Chronicity:  New Context: plant contact   Context: not animal contact, not chemical exposure, not food, not hot tub use, not insect bite/sting, not medications, not new detergent/soap and not sick contacts   Relieved by:  Nothing Worsened by:  Nothing tried Ineffective treatments:  Topical steroids Associated symptoms: myalgias   Associated symptoms: no abdominal pain, no diarrhea, no fatigue, no fever, no headaches, no induration, no joint pain, no nausea, no shortness of breath, no sore throat and no throat swelling     Past Medical History  Diagnosis Date  . Asthma     childhood  . Seasonal allergies    Past Surgical History  Procedure Laterality Date  . Ethmoidectomy      age 36   Family History  Problem Relation Age of Onset  . Alcohol abuse Father   . Arthritis Paternal Grandfather   . Stroke Paternal Grandfather   . Breast cancer Neg Hx   . Colon polyps Neg Hx   . Heart disease Neg Hx   . Heart attack Father     deceased age 56  . Colon  cancer Neg Hx   . Hypertension Neg Hx   . Hyperlipidemia Neg Hx    History  Substance Use Topics  . Smoking status: Former Games developer  . Smokeless tobacco: Never Used     Comment: quit 2006 history of 1/2 ppd for 6 years  . Alcohol Use: Yes     Comment: rare once a month    Review of Systems  Constitutional: Negative for fever, chills, diaphoresis, activity change, appetite change, fatigue and unexpected weight change.  HENT: Negative.  Negative for sore throat.   Eyes: Negative.   Respiratory: Negative for shortness of breath.   Cardiovascular: Negative.   Gastrointestinal: Negative for nausea, abdominal pain and diarrhea.  Endocrine: Positive for polydipsia. Negative for polyphagia and polyuria.  Genitourinary: Negative.   Musculoskeletal: Positive for myalgias. Negative for back pain, joint swelling and arthralgias.  Skin: Positive for rash.  Neurological: Negative for headaches.    Allergies  Review of patient's allergies indicates no known allergies.  Home Medications   Prior to Admission medications   Medication Sig Start Date End Date Taking? Authorizing Provider  doxycycline (VIBRAMYCIN) 100 MG capsule Take 1 capsule (100 mg total) by mouth 2 (two) times daily. Take with food. 12/26/14   Lattie Haw, MD  pantoprazole (PROTONIX) 40 MG tablet Take 1 tablet (40 mg  total) by mouth daily. 03/16/13   Bradd CanaryStacey A Blyth, MD  zolpidem (AMBIEN) 5 MG tablet Take 1 tablet (5 mg total) by mouth at bedtime as needed for sleep. 01/09/12 01/08/13  Edwyna Perfecthomas W Hodgin, MD   BP 121/76 mmHg  Pulse 94  Temp(Src) 98.1 F (36.7 C) (Oral)  Resp 18  Ht 5\' 11"  (1.803 m)  Wt 175 lb (79.379 kg)  BMI 24.42 kg/m2  SpO2 96% Physical Exam  Constitutional: He is oriented to person, place, and time. He appears well-developed and well-nourished. No distress.  HENT:  Head: Normocephalic.  Mouth/Throat: Oropharynx is clear and moist.  Eyes: Conjunctivae are normal. Pupils are equal, round, and reactive to  light.  Neck: Neck supple.  Cardiovascular: Normal heart sounds.   Pulmonary/Chest: Breath sounds normal.  Abdominal: He exhibits no mass. There is no tenderness.  Musculoskeletal: He exhibits no edema.  Lymphadenopathy:    He has no cervical adenopathy.  Neurological: He is alert and oriented to person, place, and time.  Skin: Skin is warm and dry. Rash noted. Rash is macular.     Lower legs below the knee have scattered small erythematous patches and excoriations that suggest a contact dermatitis.  On the left pre-tibial area as noted on diagram is a 4cm diameter nummular lesion, more erythematous than other lesions but not indurated or tender to palpation.     Nursing note and vitals reviewed.   ED Course  Procedures  None    Labs Reviewed  CK  COMPLETE METABOLIC PANEL WITH GFR  POCT CBC W AUTO DIFF ([facility]):  WBC 11.9; LY 16.9; MO 5.3; GR 77.8; Hgb 16.0; Platelets 289   POCT URINALYSIS DIPSTICK:  SG >= 1.030; otherwise negative      MDM   1. Contact dermatitis; ?secondary cellulitis   2. Polydipsia ?etiology.  Note leukocytosis 11.9    Kenalog 40mg  IM.  Begin doxycycline for staph coverage. Check electrolytes and renal function with CMP.  CK pending. Followup with Family Doctor     Lattie HawStephen A Beese, MD 12/28/14 [phone]

## 2014-12-26 NOTE — ED Notes (Signed)
Worked in yard 7 days ago; began noticing rash on lower extremities progressing; very red circular rash area around break in skin on left shin. No knowledge of Tetanus immunization status.

## 2016-06-22 ENCOUNTER — Encounter: Payer: Self-pay | Admitting: *Deleted

## 2016-06-22 ENCOUNTER — Emergency Department (INDEPENDENT_AMBULATORY_CARE_PROVIDER_SITE_OTHER): Payer: Managed Care, Other (non HMO)

## 2016-06-22 ENCOUNTER — Emergency Department
Admission: EM | Admit: 2016-06-22 | Discharge: 2016-06-22 | Disposition: A | Discharge status: Home / Self Care | Payer: Managed Care, Other (non HMO) | Source: Home / Self Care | Attending: Family Medicine | Admitting: Family Medicine

## 2016-06-22 DIAGNOSIS — R1031 Right lower quadrant pain: Secondary | ICD-10-CM

## 2016-06-22 DIAGNOSIS — R6883 Chills (without fever): Secondary | ICD-10-CM

## 2016-06-22 DIAGNOSIS — R634 Abnormal weight loss: Secondary | ICD-10-CM

## 2016-06-22 LAB — POCT CBC W AUTO DIFF (K'VILLE URGENT CARE)

## 2016-06-22 MED ORDER — POLYETHYLENE GLYCOL 3350 17 G PO PACK: 17.0000 g | PACK | Freq: Every day | ORAL | 0 refills | Status: AC

## 2016-06-22 MED ORDER — TRAMADOL HCL 50 MG PO TABS: 50.0000 mg | ORAL_TABLET | Freq: Four times a day (QID) | ORAL | 0 refills | Status: DC | PRN

## 2016-06-22 MED ORDER — IOPAMIDOL (ISOVUE-300) INJECTION 61%
80.0000 mL | Freq: Once | INTRAVENOUS | Status: AC | PRN
  Administered 2016-06-22: 100 mL via INTRAVENOUS

## 2016-06-22 NOTE — ED Triage Notes (Signed)
Pt c/o RLQ abd pain, chills, and sweats x 4 days. Denies fever, N/V/D. Reports decreased appetite for 3 wks with a 14-15lb weight loss.

## 2016-06-22 NOTE — ED Provider Notes (Signed)
CSN: [id number]     Arrival date & time 06/22/16  1337 History   First MD Initiated Contact with Patient 06/22/16 1407     Chief Complaint  Patient presents with  . Abdominal Pain   (Consider location/radiation/quality/duration/timing/severity/associated sxs/prior Treatment) HPI  Antonio Calhoun is a 50 y.o. male presenting to UC with c/o persistent RLQ abdominal pain that started about 4 days ago.  Associated chills and sweats. Pain waxes and wanes in severity. Currently a dull ache but occasionally a sharp stabbing pain. Pain is 5/10 at this time but 10/10 at worst.  He notes he noticed it the other day with sudden onset of pain after standing up from his desk.  He reports doing some minor yard work this weekend but no heavy lifting or known injuries. Denies n/v/d. Denies recorded fever. He does note he has a decreased appetite and has lost ~15 pounds in 3 weeks unintentionally.  Denies urinary symptoms. No hx of kidney stones, diverticulitis, gallbladder issues or abdominal surgeries.     Past Medical History:  Diagnosis Date  . Asthma    childhood  . Seasonal allergies    Past Surgical History:  Procedure Laterality Date  . ETHMOIDECTOMY     age 50   Family History  Problem Relation Age of Onset  . Alcohol abuse Father   . Heart attack Father     deceased age 50  . Arthritis Paternal Grandfather   . Stroke Paternal Grandfather   . COPD Mother   . Breast cancer Neg Hx   . Colon polyps Neg Hx   . Heart disease Neg Hx   . Colon cancer Neg Hx   . Hypertension Neg Hx   . Hyperlipidemia Neg Hx    Social History  Substance Use Topics  . Smoking status: Former Games developermoker  . Smokeless tobacco: Never Used     Comment: quit 2006 history of 1/2 ppd for 6 years  . Alcohol use Yes     Comment: rare once a month    Review of Systems  Constitutional: Positive for appetite change, chills and unexpected weight change ( ~15lb loss in 3 weeks ). Negative for fever.  HENT: Negative for sore  throat, trouble swallowing and voice change.   Respiratory: Negative for chest tightness and shortness of breath.   Cardiovascular: Negative for chest pain and palpitations.  Gastrointestinal: Positive for abdominal pain. Negative for blood in stool, constipation, diarrhea, nausea and vomiting.  Genitourinary: Negative for flank pain and hematuria.  Musculoskeletal: Negative for arthralgias, back pain and myalgias.  Skin: Negative for rash.    Allergies  Patient has no known allergies.  Home Medications   Prior to Admission medications   Medication Sig Start Date End Date Taking? Authorizing Provider  polyethylene glycol (MIRALAX / GLYCOLAX) packet Take 17 g by mouth daily. 06/22/16   Junius FinnerErin O'Malley, PA-C  traMADol (ULTRAM) 50 MG tablet Take 1 tablet (50 mg total) by mouth every 6 (six) hours as needed. 06/22/16   Junius FinnerErin O'Malley, PA-C   Meds Ordered and Administered this Visit  Medications - No data to display  BP 147/89 (BP Location: Left Arm)   Pulse 80   Temp 98.3 F (36.8 C) (Oral)   Resp 16   Ht 5\' 11"  (1.803 m)   Wt 163 lb (73.9 kg)   SpO2 98%   BMI 22.73 kg/m  No data found.   Physical Exam  Constitutional: He appears well-developed and well-nourished. No distress.  Thin male lying  on exam bed, NAD.  HENT:  Head: Normocephalic and atraumatic.  Mouth/Throat: Oropharynx is clear and moist.  Eyes: Conjunctivae are normal. No scleral icterus.  Neck: Normal range of motion. Neck supple.  Cardiovascular: Normal rate, regular rhythm and normal heart sounds.   Pulmonary/Chest: Effort normal and breath sounds normal. No respiratory distress. He has no wheezes. He has no rales. He exhibits no tenderness.  Abdominal: Soft. Bowel sounds are normal. He exhibits no distension and no mass. There is tenderness. There is guarding and tenderness at McBurney's point. There is no rebound and no CVA tenderness.    Musculoskeletal: Normal range of motion.  Neurological: He is alert.    Skin: Skin is warm and dry. He is not diaphoretic.  Nursing note and vitals reviewed.   Urgent Care Course   Clinical Course     Procedures (including critical care time)  Labs Review Labs Reviewed  COMPLETE METABOLIC PANEL WITH GFR  POCT CBC W AUTO DIFF ([facility])    Imaging Review Ct Abdomen Pelvis W Contrast  Result Date: 06/22/2016 CLINICAL DATA:  Right lower quadrant pain, chills, sweats for 4 days. EXAM: CT ABDOMEN AND PELVIS WITH CONTRAST TECHNIQUE: Multidetector CT imaging of the abdomen and pelvis was performed using the standard protocol following bolus administration of intravenous contrast. CONTRAST:  100mL ISOVUE-300 IOPAMIDOL (ISOVUE-300) INJECTION 61% COMPARISON:  None. FINDINGS: Lower chest: Lung bases are clear. No effusions. Heart is normal size. Hepatobiliary: No focal hepatic abnormality. Gallbladder unremarkable. Pancreas: No focal abnormality or ductal dilatation. Spleen: No focal abnormality.  Normal size. Adrenals/Urinary Tract: No adrenal abnormality. No focal renal abnormality. No stones or hydronephrosis. Urinary bladder is unremarkable. Stomach/Bowel: Appendix normal. Stomach, large and small bowel grossly unremarkable. Vascular/Lymphatic: No evidence of aneurysm or adenopathy. Reproductive: No visible focal abnormality. Other: No free fluid or free air. Musculoskeletal: No acute bony abnormality or focal bone lesion. IMPRESSION: Normal appendix. No acute findings in the abdomen or pelvis. Electronically Signed   By: Charlett NoseKevin  Dover M.D.   On: 06/22/2016 16:26     MDM   1. RLQ abdominal pain   2. Weight loss, unintentional    Pt c/o severe intermittent RLQ abdomina pain for about 4 days. Also reports unintentional weight loss.  Pt does have tenderness with guarding of RLQ. Concern for appendicitis due to severity of pain, however, pt is afebrile, no vomiting and CBC- WNL.  CT abd: normal appendix. No acute findings in abdomen or pelvis to  explain pt's severe pain.  Possible muscle strain in that area vs stool as source of pt's pain. Reassured pt of no emergency/acute finding of pain, however, no definitive cause of pain found today.  Rx: tramadol and miralax  Home care instructions provided. Encouraged to go to ED if symptoms worsening. F/u with PCP in 1-2 weeks for recheck of symptoms and ongoing healthcare needs.     Junius Finnerrin O'Malley, PA-C 06/22/16 [phone]

## 2016-06-22 NOTE — Discharge Instructions (Signed)
°  Tramadol is strong pain medication. While taking, do not drink alcohol, drive, or perform any other activities that requires focus while taking these medications.  ° °

## 2016-06-23 ENCOUNTER — Telehealth: Payer: Self-pay | Admitting: Emergency Medicine

## 2016-06-23 LAB — COMPLETE METABOLIC PANEL WITH GFR
ALT: 22 U/L (ref 9–46)
AST: 26 U/L (ref 10–35)
Albumin: 4.5 g/dL (ref 3.6–5.1)
Alkaline Phosphatase: 63 U/L (ref 40–115)
BUN: 15 mg/dL (ref 7–25)
CO2: 26 mmol/L (ref 20–31)
Calcium: 9.3 mg/dL (ref 8.6–10.3)
Chloride: 106 mmol/L (ref 98–110)
Creat: 0.82 mg/dL (ref 0.70–1.33)
GFR, Est African American: >89 mL/min (ref >=60)
GFR, Est Non African American: >89 mL/min (ref >=60)
Glucose, Bld: 85 mg/dL (ref 65–99)
Potassium: 4.4 mmol/L (ref 3.5–5.3)
Sodium: 139 mmol/L (ref 135–146)
Total Bilirubin: 1 mg/dL (ref 0.2–1.2)
Total Protein: 6.7 g/dL (ref 6.1–8.1)

## 2016-06-27 ENCOUNTER — Encounter: Payer: Self-pay | Admitting: *Deleted

## 2016-06-27 ENCOUNTER — Emergency Department
Admission: EM | Admit: 2016-06-27 | Discharge: 2016-06-27 | Disposition: A | Discharge status: Home / Self Care | Payer: Managed Care, Other (non HMO) | Source: Home / Self Care | Attending: Family Medicine | Admitting: Family Medicine

## 2016-06-27 ENCOUNTER — Emergency Department (INDEPENDENT_AMBULATORY_CARE_PROVIDER_SITE_OTHER): Payer: Managed Care, Other (non HMO)

## 2016-06-27 ENCOUNTER — Other Ambulatory Visit: Payer: Self-pay

## 2016-06-27 DIAGNOSIS — R55 Syncope and collapse: Secondary | ICD-10-CM

## 2016-06-27 DIAGNOSIS — R634 Abnormal weight loss: Secondary | ICD-10-CM | POA: Diagnosis not present

## 2016-06-27 DIAGNOSIS — R61 Generalized hyperhidrosis: Secondary | ICD-10-CM | POA: Diagnosis not present

## 2016-06-27 DIAGNOSIS — R739 Hyperglycemia, unspecified: Secondary | ICD-10-CM

## 2016-06-27 LAB — POCT CBC W AUTO DIFF (K'VILLE URGENT CARE)

## 2016-06-27 LAB — POCT URINALYSIS DIP (MANUAL ENTRY)
Blood, UA: NEGATIVE
Glucose, UA: NEGATIVE
Leukocytes, UA: NEGATIVE
Nitrite, UA: NEGATIVE
Protein Ur, POC: NEGATIVE
Spec Grav, UA: >=1.03 (ref 1.005–1.03)
Urobilinogen, UA: 0.2 (ref 0–1)
pH, UA: 5.5 (ref 5–8)

## 2016-06-27 LAB — POCT FASTING CBG KUC MANUAL ENTRY: POCT Glucose (KUC): 126 mg/dL — AB (ref 70–99)

## 2016-06-27 NOTE — ED Triage Notes (Signed)
Patient reports sudden onset generalized tingling, diaphoresis and near syncope @ 230pm today. He was seen about 1 week ago for abdominal pain that is almost resolved, but still c/o some low back "soreness". Denies any pain or nausea. He has eaten well today and no new medication taken or missed.

## 2016-06-27 NOTE — ED Provider Notes (Signed)
CSN: [id number]     Arrival date & time 06/27/16  1555 History   First MD Initiated Contact with Patient 06/27/16 1609     Chief Complaint  Patient presents with  . Tingling  . Back Pain  . Near Syncope   (Consider location/radiation/quality/duration/timing/severity/associated sxs/prior Treatment) HPI  Antonio Calhoun is a 50 y.o. male presenting to UC with c/o "tingling all over" and reports of near syncope around 2:30PM today while at work. Pt states he was speaking with a co-worker when he suddenly broke out into a sweat and became pale.  He sat down and eventually felt better but still has tingling and some Left sided back soreness.  He was seen at this same facility about 1 week ago for RLQ abdominal pain and unintentional weight loss but CT was normal and abdominal pain has nearly resolved so he never f/u with his PCP. Denies recent URI, vomiting or diarrhea. Denies chest pain, palpitations or SOB. Denies hx of similar episode of near syncope. Denies hx of DM.  Pt notes he does work 12-13 hour days and only eats breakfast, then drinks water throughout the day until eating dinner at night.   Denies headache, dizziness, or change in vision.   Past Medical History:  Diagnosis Date  . Asthma    childhood  . Seasonal allergies    Past Surgical History:  Procedure Laterality Date  . ETHMOIDECTOMY     age 50   Family History  Problem Relation Age of Onset  . Alcohol abuse Father   . Heart attack Father     deceased age 50  . Arthritis Paternal Grandfather   . Stroke Paternal Grandfather   . COPD Mother   . Breast cancer Neg Hx   . Colon polyps Neg Hx   . Heart disease Neg Hx   . Colon cancer Neg Hx   . Hypertension Neg Hx   . Hyperlipidemia Neg Hx    Social History  Substance Use Topics  . Smoking status: Former Games developermoker  . Smokeless tobacco: Never Used     Comment: quit 2006 history of 1/2 ppd for 6 years  . Alcohol use Yes     Comment: rare once a month    Review of  Systems  Constitutional: Positive for diaphoresis. Negative for chills and fever.  HENT: Negative for congestion, ear pain, sore throat, trouble swallowing and voice change.   Respiratory: Negative for cough and shortness of breath.   Cardiovascular: Negative for chest pain and palpitations.  Gastrointestinal: Negative for abdominal pain, diarrhea, nausea and vomiting.  Musculoskeletal: Negative for arthralgias, back pain and myalgias.  Skin: Negative for rash.  Neurological: Positive for syncope (near-syncope), weakness ( generalized), light-headedness and numbness ("tingling all over"). Negative for dizziness, tremors and headaches.    Allergies  Patient has no known allergies.  Home Medications   Prior to Admission medications   Medication Sig Start Date End Date Taking? Authorizing Provider  polyethylene glycol (MIRALAX / GLYCOLAX) packet Take 17 g by mouth daily. 06/22/16   Junius FinnerErin O'Malley, PA-C  traMADol (ULTRAM) 50 MG tablet Take 1 tablet (50 mg total) by mouth every 6 (six) hours as needed. 06/22/16   Junius FinnerErin O'Malley, PA-C   Meds Ordered and Administered this Visit  Medications - No data to display  BP 140/89 (BP Location: Left Arm)   Pulse 75   Temp 98.3 F (36.8 C) (Oral)   SpO2 96%  No data found.   Physical Exam  Constitutional: He  is oriented to person, place, and time. He appears well-developed and well-nourished. No distress.  Thin male sitting on exam bed, NAD.  HENT:  Head: Normocephalic and atraumatic.  Right Ear: Tympanic membrane normal.  Left Ear: Tympanic membrane normal.  Nose: Nose normal.  Mouth/Throat: Uvula is midline, oropharynx is clear and moist and mucous membranes are normal.  Eyes: EOM are normal.  Neck: Normal range of motion. Neck supple.  Cardiovascular: Normal rate and regular rhythm.   Pulmonary/Chest: Effort normal and breath sounds normal. No respiratory distress. He has no wheezes. He has no rales.  Abdominal: Soft. He exhibits no  distension. There is no tenderness. There is no CVA tenderness.  Musculoskeletal: Normal range of motion. He exhibits tenderness. He exhibits no edema.  No midline spinal tenderness. Mild tenderness to mid left side back. Full ROM upper and lower extremities. 5/5 strength bilaterally.   Neurological: He is alert and oriented to person, place, and time.  Skin: Skin is warm and dry. He is not diaphoretic.  Psychiatric: He has a normal mood and affect. His behavior is normal.  Nursing note and vitals reviewed.   Urgent Care Course   Clinical Course     Procedures (including critical care time)  Labs Review Labs Reviewed  POCT FASTING CBG [facility] MANUAL ENTRY - Abnormal; Notable for the following:       Result Value   POCT Glucose ([facility]) 126 (*)    All other components within normal limits  POCT URINALYSIS DIP (MANUAL ENTRY) - Abnormal; Notable for the following:    Bilirubin, UA small (*)    Ketones, POC UA trace (5) (*)    All other components within normal limits  BASIC METABOLIC PANEL  TSH  POCT CBC W AUTO DIFF ([facility])    Imaging Review Dg Chest 2 View  Result Date: 06/27/2016 CLINICAL DATA:  Diaphoresis and near syncope EXAM: CHEST  2 VIEW COMPARISON:  October 12, 2011 FINDINGS: Lungs are clear. Heart size and pulmonary vascularity are normal. No adenopathy. No bone lesions. IMPRESSION: No edema or consolidation. Electronically Signed   By: Bretta Bang III M.D.   On: 06/27/2016 17:11    Date/Time:06/27/16    16:37:31 Ventricular Rate: 69 PR Interval: 136 QRS Duration: 80 QT Interval: 378 QTC Calculation: 405 P-R-T Axes: 58  82  67  Text Interpretation: Normal sinus rhythm. Septal infarct, age undetermined. Abnormal EKG.   Reviewed EKG with Dr. Denyse Amass, EKG appears normal.     MDM   1. Near syncope   2. Hyperglycemia   3. Weight loss, unintentional    Pt reports episode of near syncope and tingling sensation. Pt was at this facility about 1  week ago for RLQ abdominal pain, workup was normal at that time.  CBG: 126- this could be considered elevated given pt notes he has only drank water since breakfast early this morning.  Encouraged to have it rechecked with his PCP next week.  EKG- unremarkable CBC- unremarkable UA- unremarkable  BMP and TSH sent to lab.   Consulted with Dr. Denyse Amass, Primary Care, who agrees CXR warranted to check for possible lung cancer as pt reports unintentional weight loss, near syncope and diaphoresis today, and was a former cigarette smoker.  CXR: WNL  Symptoms could stem from pt working 12-13 hour shifts w/o eating and only drinking water. Strongly encouraged to take small snacks such as fruits or vegetables if he cannot or does not want to eat lunch during the day.  F/u with PCP next week. Discussed symptoms that warrant emergent care in the ED.    Junius Finnerrin O'Malley, PA-C 06/27/16 [phone]

## 2016-06-28 LAB — BASIC METABOLIC PANEL
BUN: 19 mg/dL (ref 7–25)
CO2: 26 mmol/L (ref 20–31)
Calcium: 9.4 mg/dL (ref 8.6–10.3)
Chloride: 105 mmol/L (ref 98–110)
Creat: 0.85 mg/dL (ref 0.70–1.33)
Glucose, Bld: 109 mg/dL — ABNORMAL HIGH (ref 65–99)
Potassium: 3.9 mmol/L (ref 3.5–5.3)
Sodium: 139 mmol/L (ref 135–146)

## 2016-06-28 LAB — TSH: TSH: 1.86 mIU/L (ref 0.40–4.50)

## 2018-12-23 ENCOUNTER — Emergency Department
Admission: EM | Admit: 2018-12-23 | Discharge: 2018-12-23 | Disposition: A | Discharge status: Home / Self Care | Payer: Managed Care, Other (non HMO) | Source: Home / Self Care

## 2018-12-23 ENCOUNTER — Other Ambulatory Visit: Payer: Self-pay

## 2018-12-23 DIAGNOSIS — S86112A Strain of other muscle(s) and tendon(s) of posterior muscle group at lower leg level, left leg, initial encounter: Secondary | ICD-10-CM

## 2018-12-23 DIAGNOSIS — S86812A Strain of other muscle(s) and tendon(s) at lower leg level, left leg, initial encounter: Secondary | ICD-10-CM

## 2018-12-23 MED ORDER — MELOXICAM 15 MG PO TABS: ORAL_TABLET | ORAL | 3 refills | Status: DC

## 2018-12-23 NOTE — ED Provider Notes (Signed)
[facility]    CSN: [id number] Arrival date & time: 12/23/18  [id number]     History   Chief Complaint Chief Complaint  Patient presents with  . Leg Pain    left calf    HPI Antonio Calhoun is a 53 y.o. male.   HPI Antonio Calhoun is a 53 y.o. male presenting to UC with c/o sudden onset severe Left calf pain that started about 1 hour PTA after taking 3 steps at work. Denies sudden movement or twisting or falling.  Pain did cause him to pause and hold onto a coworker for support due to severity of pain.  Pain is 2-3/10 at rest but shoots up to 10/10 with certain movements such as dorsiflexing and plantar flexing his foot.  He took 4 ibuprofen shortly after incident but only minimal relief. No hx of blood clots. No prior injury to same leg.   Past Medical History:  Diagnosis Date  . Asthma    childhood  . Seasonal allergies     Patient Active Problem List   Diagnosis Date Noted  . Gastrocnemius strain, left 12/23/2018  . Insomnia 01/09/2012  . GERD (gastroesophageal reflux disease) 01/09/2012  . Annual physical exam 10/10/2011  . Elevated blood pressure 10/10/2011  . Atelectasis 09/30/2011    Past Surgical History:  Procedure Laterality Date  . ETHMOIDECTOMY     age 53       Home Medications    Prior to Admission medications   Medication Sig Start Date End Date Taking? Authorizing Provider  meloxicam (MOBIC) 15 MG tablet One tab PO qAM with breakfast for 2 weeks, then daily prn pain. 12/23/18   Monica Bectonhekkekandam, Thomas J, MD  polyethylene glycol (MIRALAX / Ethelene HalGLYCOLAX) packet Take 17 g by mouth daily. 06/22/16   Lurene ShadowPhelps, Jakiera Ehler O, PA-C  traMADol (ULTRAM) 50 MG tablet Take 1 tablet (50 mg total) by mouth every 6 (six) hours as needed. 06/22/16   Lurene ShadowPhelps, Rebekha Diveley O, PA-C    Family History Family History  Problem Relation Age of Onset  . Alcohol abuse Father   . Heart attack Father        deceased age 53  . Arthritis Paternal Grandfather   . Stroke Paternal Grandfather    . COPD Mother   . Breast cancer Neg Hx   . Colon polyps Neg Hx   . Heart disease Neg Hx   . Colon cancer Neg Hx   . Hypertension Neg Hx   . Hyperlipidemia Neg Hx     Social History Social History   Tobacco Use  . Smoking status: Former Games developermoker  . Smokeless tobacco: Never Used  . Tobacco comment: quit 2006 history of 1/2 ppd for 6 years  Substance Use Topics  . Alcohol use: Yes    Comment: rare once a month  . Drug use: No     Allergies   Patient has no known allergies.   Review of Systems Review of Systems  Musculoskeletal: Positive for gait problem and myalgias. Negative for arthralgias and joint swelling.  Skin: Negative for color change and wound.     Physical Exam Triage Vital Signs ED Triage Vitals  Enc Vitals Group     BP 12/23/18 1024 (!) 144/86     Pulse Rate 12/23/18 1024 65     Resp 12/23/18 1024 20     Temp 12/23/18 1024 (!) 97.1 F (36.2 C)     Temp Source 12/23/18 1024 Tympanic     SpO2 12/23/18 1024 98 %  Weight 12/23/18 1025 181 lb (82.1 kg)     Height 12/23/18 1025  (1.803 m)     Head Circumference --      Peak Flow --      Pain Score 12/23/18 1025 3     Pain Loc --      Pain Edu? --      Excl. in GC? --    No data found.  Updated Vital Signs BP (!) 144/86 (BP Location: Right Arm)   Pulse 65   Temp (!) 97.1 F (36.2 C) (Tympanic)   Resp 20   Ht  (1.803 m)   Wt 181 lb (82.1 kg)   SpO2 98%   BMI 25.24 kg/m   Visual Acuity Right Eye Distance:   Left Eye Distance:   Bilateral Distance:    Right Eye Near:   Left Eye Near:    Bilateral Near:     Physical Exam Vitals signs and nursing note reviewed.  Constitutional:      Appearance: Normal appearance.  HENT:     Head: Normocephalic.     Mouth/Throat:     Mouth: Mucous membranes are moist.  Cardiovascular:     Rate and Rhythm: Normal rate and regular rhythm.     Pulses:          Dorsalis pedis pulses are 2+ on the left side.       Posterior tibial pulses  are 2+ on the left side.  Pulmonary:     Effort: Pulmonary effort is normal.  Musculoskeletal: Normal range of motion.        General: Swelling and tenderness present.     Comments: Left calf: mild tenderness along medial/anterior aspect. Muscle compartment is soft. Mild edema compared to Right. No masses palpated. Full ROM knee. Full ROM ankle with increased pain in calf. No bony tenderness.   Skin:    General: Skin is warm and dry.     Findings: No bruising or erythema.  Neurological:     Mental Status: He is alert.      UC Treatments / Results  Labs (all labs ordered are listed, but only abnormal results are displayed) Labs Reviewed - No data to display  EKG None  Radiology No results found.  Procedures Procedures (including critical care time)  Medications Ordered in UC Medications - No data to display  Initial Impression / Assessment and Plan / UC Course  I have reviewed the triage vital signs and the nursing notes.  Pertinent labs & imaging results that were available during my care of the patient were reviewed by me and considered in my medical decision making (see chart for details).     Doubt DVT due to sudden onset of symptoms Concern for possible muscle tear Consulted with Dr. Benjamin Stain, Sports Medicine who also examined pt. Will tx pt for muscle strain, ace wrap was applied and pt was provided with heel lifts. Home care info provided by Dr. Benjamin Stain along with prescription for meloxicam   Final Clinical Impressions(s) / UC Diagnoses   Final diagnoses:  Strain of calf muscle, left, initial encounter     Discharge Instructions      Please take the medication as prescribed by Dr. Benjamin Stain and call to schedule a follow up with him in 2 weeks.     ED Prescriptions    Medication Sig Dispense Auth. Provider   meloxicam (MOBIC) 15 MG tablet One tab PO qAM with breakfast for 2 weeks, then daily prn  pain. 30 tablet Monica Becton,  MD     Controlled Substance Prescriptions [place] Controlled Substance Registry consulted? Not Applicable   Rolla Plate 12/23/18 1142

## 2018-12-23 NOTE — Assessment & Plan Note (Signed)
Pain referrable to the medial head of the gastrocnemius and the soleus. Adding heel lifts, meloxicam, calf was strapped with a compressive dressing. Out of work today and tomorrow, desk duty for 2 weeks. Rehab exercises given, return to see me in 2 weeks to reevaluate.

## 2018-12-23 NOTE — ED Triage Notes (Signed)
About 1 hour ago, was walking from 1 office to another at work, and felt and heard a "pop" in left calf just below the knee.  Any flexion or extension in foot causes severe pain.

## 2018-12-23 NOTE — Discharge Instructions (Signed)
°  Please take the medication as prescribed by Dr. Benjamin Stain and call to schedule a follow up with him in 2 weeks.

## 2018-12-23 NOTE — Consult Note (Signed)
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  Today he took a step, felt a severe pain in his calf, this brought him to his knees.  Pain was severe, persistent, localized without radiation.  No swelling, no chest pain, no shortness of breath.  I reviewed the past medical history, family history, social history, surgical history, and allergies today and no changes were needed.  Please see the problem list section below in epic for further details.  Past Medical History: Past Medical History:  Diagnosis Date  . Asthma    childhood  . Seasonal allergies    Past Surgical History: Past Surgical History:  Procedure Laterality Date  . ETHMOIDECTOMY     age 53   Social History: Social History   Socioeconomic History  . Marital status: Married    Spouse name: Not on file  . Number of children: Not on file  . Years of education: Not on file  . Highest education level: Not on file  Occupational History  . Not on file  Social Needs  . Financial resource strain: Not on file  . Food insecurity:    Worry: Not on file    Inability: Not on file  . Transportation needs:    Medical: Not on file    Non-medical: Not on file  Tobacco Use  . Smoking status: Former Games developermoker  . Smokeless tobacco: Never Used  . Tobacco comment: quit 2006 history of 1/2 ppd for 6 years  Substance and Sexual Activity  . Alcohol use: Yes    Comment: rare once a month  . Drug use: No  . Sexual activity: Not on file  Lifestyle  . Physical activity:    Days per week: Not on file    Minutes per session: Not on file  . Stress: Not on file  Relationships  . Social connections:    Talks on phone: Not on file    Gets together: Not on file    Attends religious service: Not on file    Active member of club or organization: Not on file    Attends meetings of clubs or organizations: Not  on file    Relationship status: Not on file  Other Topics Concern  . Not on file  Social History Narrative  . Not on file   Family History: Family History  Problem Relation Age of Onset  . Alcohol abuse Father   . Heart attack Father        deceased age 53  . Arthritis Paternal Grandfather   . Stroke Paternal Grandfather   . COPD Mother   . Breast cancer Neg Hx   . Colon polyps Neg Hx   . Heart disease Neg Hx   . Colon cancer Neg Hx   . Hypertension Neg Hx   . Hyperlipidemia Neg Hx    Allergies: No Known Allergies Medications: See med rec.  Review of Systems: No headache, visual changes, nausea, vomiting, diarrhea, constipation, dizziness, abdominal pain, skin rash, fevers, chills, night sweats, swollen lymph nodes, weight loss, chest pain, body aches, joint swelling, muscle aches, shortness of breath, mood changes, visual or auditory hallucinations.  Objective:    General: Well Developed, well nourished, and in no acute distress.  Neuro: Alert and oriented x3, extra-ocular muscles intact, sensation grossly intact.  HEENT: Normocephalic, atraumatic, pupils equal round reactive  to light, neck supple, no masses, no lymphadenopathy, thyroid nonpalpable.  Skin: Warm and dry, no rashes noted.  Cardiac: Regular rate and rhythm, no murmurs rubs or gallops.  Respiratory: Clear to auscultation bilaterally. Not using accessory muscles, speaking in full sentences.  Abdominal: Soft, nontender, nondistended, positive bowel sounds, no masses, no organomegaly.  Left ankle: No visible erythema or swelling. Tenderness to palpation at the medial head of the gastrocnemius with reproduction of pain with resisted plantar flexion Range of motion is full in all directions. Strength is 5/5 in all directions. Stable lateral and medial ligaments; squeeze test and kleiger test unremarkable; Talar dome nontender; No pain at base of 5th MT; No tenderness over cuboid; No tenderness over N spot or  navicular prominence No tenderness on posterior aspects of lateral and medial malleolus No sign of peroneal tendon subluxations; Negative tarsal tunnel tinel's Able to walk 4 steps.  The calf was wrapped with a compressive dressing.  Impression and Recommendations:    The patient was counselled, risk factors were discussed, anticipatory guidance given.  Gastrocnemius strain, left Pain referrable to the medial head of the gastrocnemius and the soleus. Adding heel lifts, meloxicam, calf was strapped with a compressive dressing. Out of work today and tomorrow, desk duty for 2 weeks. Rehab exercises given, return to see me in 2 weeks to reevaluate.   ___________________________________________ Ihor Austin. Benjamin Stain, M.D., ABFM., CAQSM. Primary Care and Sports Medicine [facility]  Adjunct Professor of Family Medicine  [employer]

## 2019-04-18 ENCOUNTER — Other Ambulatory Visit: Payer: Self-pay

## 2019-04-18 ENCOUNTER — Encounter: Payer: Self-pay | Admitting: Emergency Medicine

## 2019-04-18 ENCOUNTER — Emergency Department (INDEPENDENT_AMBULATORY_CARE_PROVIDER_SITE_OTHER): Payer: Managed Care, Other (non HMO)

## 2019-04-18 ENCOUNTER — Emergency Department
Admission: EM | Admit: 2019-04-18 | Discharge: 2019-04-18 | Disposition: A | Discharge status: Home / Self Care | Payer: Managed Care, Other (non HMO) | Source: Home / Self Care | Attending: Emergency Medicine | Admitting: Emergency Medicine

## 2019-04-18 DIAGNOSIS — M25571 Pain in right ankle and joints of right foot: Secondary | ICD-10-CM | POA: Diagnosis not present

## 2019-04-18 MED ORDER — MELOXICAM 15 MG PO TABS: 15.0000 mg | ORAL_TABLET | Freq: Every day | ORAL | 1 refills | Status: DC

## 2019-04-18 NOTE — Discharge Instructions (Addendum)
Keep leg elevated when possible. Apply an ice pack twice a day. Limit your walking. Make a follow-up appointment to see Dr. Dianah Field

## 2019-04-18 NOTE — ED Provider Notes (Signed)
[facility]    CSN: [id number] Arrival date & time: 04/18/19  0848      History   Chief Complaint Chief Complaint  Patient presents with  . Ankle Pain    HPI Bryten Maher is a 53 y.o. male.  Patient was in his usual state of health until 5 hours ago when he was walking at work and developed a severe excruciating sharp pain in his right medial upper ankle.  Since that time he has had difficulty walking.  He has a history of previous ankle injuries and has a history of a left gastrocnemius strain.  He did not twist his ankle.  He did not fall.  Since he developed the pain he has had difficulty with walking. HPI  Past Medical History:  Diagnosis Date  . Asthma    childhood  . Seasonal allergies     Patient Active Problem List   Diagnosis Date Noted  . Gastrocnemius strain, left 12/23/2018  . Insomnia 01/09/2012  . GERD (gastroesophageal reflux disease) 01/09/2012  . Annual physical exam 10/10/2011  . Elevated blood pressure 10/10/2011  . Atelectasis 09/30/2011    Past Surgical History:  Procedure Laterality Date  . ETHMOIDECTOMY     age 68       Home Medications    Prior to Admission medications   Medication Sig Start Date End Date Taking? Authorizing Provider  meloxicam (MOBIC) 15 MG tablet One tab PO qAM with breakfast for 2 weeks, then daily prn pain. 12/23/18   Silverio Decamp, MD  polyethylene glycol (MIRALAX / Floria Raveling) packet Take 17 g by mouth daily. 06/22/16   Noe Gens, PA-C  traMADol (ULTRAM) 50 MG tablet Take 1 tablet (50 mg total) by mouth every 6 (six) hours as needed. 06/22/16   Noe Gens, PA-C    Family History Family History  Problem Relation Age of Onset  . Alcohol abuse Father   . Heart attack Father        deceased age 21  . Arthritis Paternal Grandfather   . Stroke Paternal Grandfather   . COPD Mother   . Breast cancer Neg Hx   . Colon polyps Neg Hx   . Heart disease Neg Hx   . Colon cancer Neg Hx   .  Hypertension Neg Hx   . Hyperlipidemia Neg Hx     Social History Social History   Tobacco Use  . Smoking status: Former Research scientist (life sciences)  . Smokeless tobacco: Never Used  . Tobacco comment: quit 2006 history of 1/2 ppd for 6 years  Substance Use Topics  . Alcohol use: Yes    Comment: rare once a month  . Drug use: No     Allergies   Patient has no known allergies.   Review of Systems Review of Systems  Constitutional: Negative.   Respiratory: Negative.   Musculoskeletal:       Patient has pain and discomfort right ankle     Physical Exam Triage Vital Signs ED Triage Vitals  Enc Vitals Group     BP 04/18/19 0908 137/88     Pulse Rate 04/18/19 0908 80     Resp 04/18/19 0908 16     Temp 04/18/19 0908 98.6 F (37 C)     Temp Source 04/18/19 0908 Oral     SpO2 04/18/19 0908 98 %     Weight 04/18/19 0910 170 lb (77.1 kg)     Height 04/18/19 0910 5\' 11"  (1.803 m)  Head Circumference --      Peak Flow --      Pain Score 04/18/19 0910 6     Pain Loc --      Pain Edu? --      Excl. in GC? --    No data found.  Updated Vital Signs BP 137/88 (BP Location: Right Arm)   Pulse 80   Temp 98.6 F (37 C) (Oral)   Resp 16   Ht 5\' 11"  (1.803 m)   Wt 77.1 kg   SpO2 98%   BMI 23.71 kg/m   Visual Acuity Right Eye Distance:   Left Eye Distance:   Bilateral Distance:    Right Eye Near:   Left Eye Near:    Bilateral Near:     Physical Exam Constitutional:      Appearance: Normal appearance.  HENT:     Head: Normocephalic.  Neck:     Musculoskeletal: Normal range of motion.  Musculoskeletal:     Comments: There is tenderness just above the right medial malleolus.  There is no swelling in this area.  There is no tenderness over the Achilles tendon but mild discomfort at the tendon attachment to the gastrocnemius.  Dorsalis pedis 3+ normal capillary fill.  There is no swelling noted.  There is no pain with eversion or inversion or dorsiflexion of the ankle.  There is  pain with plantarflexion against resistance.  Neurological:     Mental Status: He is alert.      UC Treatments / Results  Labs (all labs ordered are listed, but only abnormal results are displayed) Labs Reviewed - No data to display  EKG   Radiology Dg Tibia/fibula Right  Result Date: 04/18/2019 CLINICAL DATA:  53 year old male with history of pain overlying the medial malleolus. EXAM: RIGHT TIBIA AND FIBULA - 2 VIEW COMPARISON:  None. FINDINGS: There is no evidence of fracture or other focal bone lesions. Soft tissues are unremarkable. IMPRESSION: Negative. Electronically Signed   By: Trudie Reed M.D.   On: 04/18/2019 10:02   Dg Ankle Complete Right  Result Date: 04/18/2019 CLINICAL DATA:  53 year old male with history of medial right ankle pain with acute onset today. EXAM: RIGHT ANKLE - COMPLETE 3+ VIEW COMPARISON:  No priors. FINDINGS: There is no evidence of fracture, dislocation, or joint effusion. There is no evidence of arthropathy or other focal bone abnormality. Soft tissues are unremarkable. Well-corticated osseous fragments adjacent to the tip of the lateral malleolus, likely sequela of remote trauma. IMPRESSION: Negative. Electronically Signed   By: Trudie Reed M.D.   On: 04/18/2019 10:02    Procedures Procedures (including critical care time)  Medications Ordered in UC Medications - No data to display  Initial Impression / Assessment and Plan / UC Course  I have reviewed the triage vital signs and the nursing notes. We will proceed with x-rays of the right ankle and distal tibia.  This could be a stress related injury such as a stress fracture because the patient does ambulate 10 to 12 miles a day at work.  In the past he has worn work boots but currently works in [employer].  Will treat with ice elevation nonsteroidals with follow-up with Dr. Morene Antu orthopedist.  Will place on light duty with gel support calf and meloxicam 15 mg daily. Pertinent labs  & imaging results that were available during my care of the patient were reviewed by me and considered in my medical decision making (see chart for details).  Final Clinical Impressions(s) / UC Diagnoses   Final diagnoses:  None   Discharge Instructions   None    ED Prescriptions    None     Controlled Substance Prescriptions [place] Controlled Substance Registry consulted? Not Applicable   Collene Gobbleaub, Ayala Ribble A, MD 04/18/19 1319

## 2019-04-18 NOTE — ED Triage Notes (Signed)
Patient reports sudden sharp pain in right ankle while walking at 0445 today; no injury or unusual motion. He has taken hydrocodone 5-325 for the pain. He has not travelled past 4 weeks.

## 2020-02-29 ENCOUNTER — Other Ambulatory Visit: Payer: Self-pay

## 2020-02-29 ENCOUNTER — Emergency Department (INDEPENDENT_AMBULATORY_CARE_PROVIDER_SITE_OTHER): Payer: Managed Care, Other (non HMO)

## 2020-02-29 ENCOUNTER — Emergency Department (INDEPENDENT_AMBULATORY_CARE_PROVIDER_SITE_OTHER)
Admission: EM | Admit: 2020-02-29 | Discharge: 2020-02-29 | Disposition: A | Discharge status: Home / Self Care | Payer: Managed Care, Other (non HMO) | Source: Home / Self Care

## 2020-02-29 DIAGNOSIS — R0602 Shortness of breath: Secondary | ICD-10-CM

## 2020-02-29 DIAGNOSIS — R000 Tachycardia, unspecified: Secondary | ICD-10-CM

## 2020-02-29 DIAGNOSIS — R05 Cough: Secondary | ICD-10-CM | POA: Diagnosis not present

## 2020-02-29 DIAGNOSIS — R509 Fever, unspecified: Secondary | ICD-10-CM

## 2020-02-29 DIAGNOSIS — R630 Anorexia: Secondary | ICD-10-CM

## 2020-02-29 DIAGNOSIS — R6889 Other general symptoms and signs: Secondary | ICD-10-CM

## 2020-02-29 DIAGNOSIS — R531 Weakness: Secondary | ICD-10-CM

## 2020-02-29 MED ORDER — ACETAMINOPHEN 325 MG PO TABS
650.0000 mg | ORAL_TABLET | Freq: Four times a day (QID) | ORAL | Status: DC | PRN
  Administered 2020-02-29: 650 mg via ORAL

## 2020-02-29 NOTE — Discharge Instructions (Signed)
  You have declined EMS transport but based on your symptoms, it is advised you drive yourself directly to the hospital for further evaluation and treatment of your symptoms this evening.

## 2020-02-29 NOTE — ED Triage Notes (Signed)
Patient presents to Urgent Care with complaints of generalized body aches, weakness, loss of appetite, congestion, shortness of breath w/ exertion, cough, insomnia, sharp pains in his bilateral shoulders intermittently, and tightness in his chest w/ deep ins[iration since the past several weeks. Patient reports he has not been tested for covid recently, states his daughter was hospitalized for pneumonia 5 weeks ago. Pt has not been vaccinated for covid.

## 2020-02-29 NOTE — ED Notes (Signed)
Patient is being discharged from the Urgent Care and sent to the Emergency Department via POV . Per Denny Peon, [place], patient is in need of higher level of care due to fever of unknown origin, tachycardia, possible sepsis. Patient is aware and verbalizes understanding of plan of care. Negative rapid covid performed on-site, PCR obtained and sent to lab. Vitals:   02/29/20 1949  BP: (!) 161/96  Pulse: (!) 111  Resp: 22  Temp: (!) 102.5 F (39.2 C)  SpO2: 95%

## 2020-02-29 NOTE — ED Provider Notes (Signed)
[facility]    CSN: [id number] Arrival date & time: 02/29/20  1832      History   Chief Complaint Chief Complaint  Patient presents with  . Fatigue    HPI Antonio Calhoun is a 54 y.o. male.   HPI  Antonio Calhoun is a 54 y.o. male presenting to UC with c/o about 10 days of worsening body aches, congestion, mild cough, SOB with exertion, cough, loss of appetite, insomnia with about 2 hours of sleep each night, intermittent shoulder pain and chest sharpness with deep inspiration.  His daughter was hospitalized with pneumonia about 5 weeks ago, otherwise no sick contacts. He has not received the Covid vaccine. No recent travel.   Temp of 102.5*F is highest temp.    Past Medical History:  Diagnosis Date  . Asthma    childhood  . Seasonal allergies     Patient Active Problem List   Diagnosis Date Noted  . Gastrocnemius strain, left 12/23/2018  . Insomnia 01/09/2012  . GERD (gastroesophageal reflux disease) 01/09/2012  . Annual physical exam 10/10/2011  . Elevated blood pressure 10/10/2011  . Atelectasis 09/30/2011    Past Surgical History:  Procedure Laterality Date  . ETHMOIDECTOMY     age 88       Home Medications    Prior to Admission medications   Medication Sig Start Date End Date Taking? Authorizing Provider  meloxicam (MOBIC) 15 MG tablet Take 1 tablet (15 mg total) by mouth daily. 04/18/19   Collene Gobble, MD  polyethylene glycol (MIRALAX / GLYCOLAX) packet Take 17 g by mouth daily. 06/22/16   Lurene Shadow, PA-C  traMADol (ULTRAM) 50 MG tablet Take 1 tablet (50 mg total) by mouth every 6 (six) hours as needed. 06/22/16   Lurene Shadow, PA-C    Family History Family History  Problem Relation Age of Onset  . Alcohol abuse Father   . Heart attack Father        deceased age 71  . Arthritis Paternal Grandfather   . Stroke Paternal Grandfather   . COPD Mother   . Breast cancer Neg Hx   . Colon polyps Neg Hx   . Heart disease Neg Hx   .  Colon cancer Neg Hx   . Hypertension Neg Hx   . Hyperlipidemia Neg Hx     Social History Social History   Tobacco Use  . Smoking status: Former Games developer  . Smokeless tobacco: Never Used  . Tobacco comment: quit 2006 history of 1/2 ppd for 6 years  Substance Use Topics  . Alcohol use: Yes    Comment: rare once a month  . Drug use: No     Allergies   Patient has no known allergies.   Review of Systems Review of Systems  Constitutional: Positive for appetite change, chills, fatigue and fever.  HENT: Positive for congestion and sore throat. Negative for ear pain, trouble swallowing and voice change.   Respiratory: Positive for cough and shortness of breath.   Cardiovascular: Positive for chest pain. Negative for palpitations.  Gastrointestinal: Positive for nausea. Negative for abdominal pain, diarrhea and vomiting.  Musculoskeletal: Positive for arthralgias, back pain and myalgias.  Skin: Negative for rash.  All other systems reviewed and are negative.    Physical Exam Triage Vital Signs ED Triage Vitals  Enc Vitals Group     BP 02/29/20 1949 (!) 161/96     Pulse Rate 02/29/20 1949 (!) 111     Resp 02/29/20  1949 22     Temp 02/29/20 1949 (!) 102.5 F (39.2 C)     Temp Source 02/29/20 1949 Oral     SpO2 02/29/20 1949 95 %     Weight --      Height --      Head Circumference --      Peak Flow --      Pain Score 02/29/20 1947 7     Pain Loc --      Pain Edu? --      Excl. in GC? --    No data found.  Updated Vital Signs BP (!) 161/96 (BP Location: Right Arm)   Pulse (!) 111   Temp (!) 102.5 F (39.2 C) (Oral)   Resp 22   SpO2 95%   Visual Acuity Right Eye Distance:   Left Eye Distance:   Bilateral Distance:    Right Eye Near:   Left Eye Near:    Bilateral Near:     Physical Exam Vitals and nursing note reviewed.  Constitutional:      Appearance: Normal appearance. He is well-developed. He is diaphoretic.  HENT:     Head: Normocephalic and  atraumatic.     Right Ear: Tympanic membrane and ear canal normal.     Left Ear: Tympanic membrane and ear canal normal.     Nose: Nose normal.     Mouth/Throat:     Lips: Pink.     Mouth: Mucous membranes are moist.     Pharynx: Oropharynx is clear. Uvula midline.  Cardiovascular:     Rate and Rhythm: Regular rhythm. Tachycardia present.  Pulmonary:     Effort: Pulmonary effort is normal. No respiratory distress.     Breath sounds: Normal breath sounds. No stridor. No wheezing, rhonchi or rales.  Musculoskeletal:        General: Normal range of motion.     Cervical back: Normal range of motion.  Skin:    General: Skin is warm.     Findings: No rash.  Neurological:     Mental Status: He is alert and oriented to person, place, and time.  Psychiatric:        Behavior: Behavior normal.      UC Treatments / Results  Labs (all labs ordered are listed, but only abnormal results are displayed) Labs Reviewed  SARS-COV-2 RNA,(COVID-19) QUALITATIVE NAAT  POC SARS CORONAVIRUS 2 AG -  ED    EKG   Radiology DG Chest 2 View  Result Date: 02/29/2020 CLINICAL DATA:  Cough and shortness of breath for several days EXAM: CHEST - 2 VIEW COMPARISON:  06/27/2016 FINDINGS: The heart size and mediastinal contours are within normal limits. Both lungs are clear. The visualized skeletal structures are unremarkable. IMPRESSION: No active cardiopulmonary disease. Electronically Signed   By: Alcide Clever M.D.   On: 02/29/2020 20:05    Procedures Procedures (including critical care time)  Medications Ordered in UC Medications  acetaminophen (TYLENOL) tablet 650 mg (650 mg Oral Given 02/29/20 2001)    Initial Impression / Assessment and Plan / UC Course  I have reviewed the triage vital signs and the nursing notes.  Pertinent labs & imaging results that were available during my care of the patient were reviewed by me and considered in my medical decision making (see chart for details).      Rapid Covid: Negative CXR: WNL   Fever of unknown origin in adult with tachycardia, SOB, generalized weakness.  Concern for PE Encouraged further  evaluation in emergency department Pt declined EMS transport. Agreeable to drive himself to [facility] this evening.  Final Clinical Impressions(s) / UC Diagnoses   Final diagnoses:  Fever in adult  Loss of appetite  Generalized weakness  Tachycardia  Shortness of breath  Flu-like symptoms     Discharge Instructions      You have declined EMS transport but based on your symptoms, it is advised you drive yourself directly to the hospital for further evaluation and treatment of your symptoms this evening.     ED Prescriptions    None     PDMP not reviewed this encounter.   Lurene Shadow, [place] 02/29/20 2304

## 2020-03-01 LAB — SARS-COV-2 RNA,(COVID-19) QUALITATIVE NAAT: SARS CoV2 RNA: NOT DETECTED

## 2020-07-18 ENCOUNTER — Other Ambulatory Visit: Payer: Self-pay

## 2020-07-18 ENCOUNTER — Emergency Department
Admission: EM | Admit: 2020-07-18 | Discharge: 2020-07-18 | Disposition: A | Discharge status: Home / Self Care | Payer: Managed Care, Other (non HMO) | Source: Home / Self Care

## 2020-07-18 ENCOUNTER — Emergency Department (INDEPENDENT_AMBULATORY_CARE_PROVIDER_SITE_OTHER): Payer: Managed Care, Other (non HMO)

## 2020-07-18 DIAGNOSIS — R079 Chest pain, unspecified: Secondary | ICD-10-CM | POA: Diagnosis not present

## 2020-07-18 DIAGNOSIS — R059 Cough, unspecified: Secondary | ICD-10-CM | POA: Diagnosis not present

## 2020-07-18 DIAGNOSIS — J40 Bronchitis, not specified as acute or chronic: Secondary | ICD-10-CM | POA: Diagnosis not present

## 2020-07-18 MED ORDER — AZITHROMYCIN 250 MG PO TABS: ORAL_TABLET | ORAL | 0 refills | Status: DC

## 2020-07-18 MED ORDER — HYDROCODONE-HOMATROPINE 5-1.5 MG/5ML PO SYRP: 5.0000 mL | ORAL_SOLUTION | Freq: Four times a day (QID) | ORAL | 0 refills | Status: DC | PRN

## 2020-07-18 NOTE — ED Provider Notes (Signed)
[name]    CSN: [id number] Arrival date & time: 07/18/20  0900      History   Chief Complaint Chief Complaint  Patient presents with  . Sore Throat  . Cough  . Nasal Congestion    HPI Antonio Calhoun is a 54 y.o. male.   Established [facility] patient  Pt c/o flu like sxs x 3 days. Started off vomiting. Yesterday developed worsening cough, productive with green mucous. Theraflu and nyquil prn. Was seen for similar illness in July. No known covid exposure. Has had both covid vaccinations.  Patient has a deep violent cough, right lateral chest pain, and h/o chills and sweats last day or so.  Illness began with nausea last Friday.  Works in [employer] for [employer].     Past Medical History:  Diagnosis Date  . Asthma    childhood  . Seasonal allergies     Patient Active Problem List   Diagnosis Date Noted  . Gastrocnemius strain, left 12/23/2018  . Insomnia 01/09/2012  . GERD (gastroesophageal reflux disease) 01/09/2012  . Annual physical exam 10/10/2011  . Elevated blood pressure 10/10/2011  . Atelectasis 09/30/2011    Past Surgical History:  Procedure Laterality Date  . ETHMOIDECTOMY     age 23       Home Medications    Prior to Admission medications   Medication Sig Start Date End Date Taking? Authorizing Provider  azithromycin (ZITHROMAX) 250 MG tablet Take 2 tabs PO x 1 dose, then 1 tab PO QD x 4 days 07/18/20   Elvina Sidle, MD  HYDROcodone-homatropine (HYDROMET) 5-1.5 MG/5ML syrup Take 5 mLs by mouth every 6 (six) hours as needed for cough. 07/18/20   Elvina Sidle, MD  meloxicam (MOBIC) 15 MG tablet Take 1 tablet (15 mg total) by mouth daily. 04/18/19   Collene Gobble, MD  polyethylene glycol (MIRALAX / GLYCOLAX) packet Take 17 g by mouth daily. 06/22/16   Lurene Shadow, PA-C  traMADol (ULTRAM) 50 MG tablet Take 1 tablet (50 mg total) by mouth every 6 (six) hours as needed. 06/22/16   Lurene Shadow, PA-C     Family History Family History  Problem Relation Age of Onset  . Alcohol abuse Father   . Heart attack Father        deceased age 55  . Arthritis Paternal Grandfather   . Stroke Paternal Grandfather   . COPD Mother   . Breast cancer Neg Hx   . Colon polyps Neg Hx   . Heart disease Neg Hx   . Colon cancer Neg Hx   . Hypertension Neg Hx   . Hyperlipidemia Neg Hx     Social History Social History   Tobacco Use  . Smoking status: Former Games developer  . Smokeless tobacco: Never Used  . Tobacco comment: quit 2006 history of 1/2 ppd for 6 years  Substance Use Topics  . Alcohol use: Yes    Comment: rare once a month  . Drug use: No     Allergies   Patient has no known allergies.   Review of Systems Review of Systems  Constitutional: Positive for chills, diaphoresis and fatigue. Negative for fever.  HENT: Positive for sore throat.   Respiratory: Positive for cough. Negative for shortness of breath.   Cardiovascular: Positive for chest pain.  Gastrointestinal: Positive for nausea. Negative for diarrhea.  Musculoskeletal: Positive for myalgias.  All other systems reviewed and are negative.    Physical Exam Triage Vital Signs  ED Triage Vitals  Enc Vitals Group     BP 07/18/20 0913 (!) 144/93     Pulse Rate 07/18/20 0913 98     Resp 07/18/20 0913 17     Temp 07/18/20 0913 98.8 F (37.1 C)     Temp Source 07/18/20 0913 Oral     SpO2 07/18/20 0913 97 %     Weight --      Height --      Head Circumference --      Peak Flow --      Pain Score 07/18/20 0914 0     Pain Loc --      Pain Edu? --      Excl. in GC? --    No data found.  Updated Vital Signs BP (!) 144/93 (BP Location: Right Arm)   Pulse 98   Temp 98.8 F (37.1 C) (Oral)   Resp 17   SpO2 97%    Physical Exam Vitals and nursing note reviewed.  Constitutional:      General: He is not in acute distress.    Appearance: He is well-developed and normal weight.  HENT:     Head: Normocephalic.      Mouth/Throat:     Pharynx: Oropharynx is clear.     Tonsils: No tonsillar exudate.  Eyes:     Conjunctiva/sclera: Conjunctivae normal.  Cardiovascular:     Rate and Rhythm: Normal rate.     Heart sounds: Normal heart sounds.  Pulmonary:     Effort: Pulmonary effort is normal.     Breath sounds: Rales present.     Comments: Right sided rales and ronchi Skin:    General: Skin is warm and dry.  Neurological:     General: No focal deficit present.     Mental Status: He is alert and oriented to person, place, and time.  Psychiatric:        Mood and Affect: Mood normal.        Behavior: Behavior normal.      UC Treatments / Results  Labs (all labs ordered are listed, but only abnormal results are displayed) Labs Reviewed - No data to display  EKG   Radiology Preliminary CXR shows no infiltrate. Procedures Procedures (including critical care time)  Medications Ordered in UC Medications - No data to display  Initial Impression / Assessment and Plan / UC Course  I have reviewed the triage vital signs and the nursing notes.  Pertinent labs & imaging results that were available during my care of the patient were reviewed by me and considered in my medical decision making (see chart for details).    Final Clinical Impressions(s) / UC Diagnoses   Final diagnoses:  Bronchitis   Discharge Instructions   None    ED Prescriptions    Medication Sig Dispense Auth. Provider   HYDROcodone-homatropine (HYDROMET) 5-1.5 MG/5ML syrup Take 5 mLs by mouth every 6 (six) hours as needed for cough. 60 mL Elvina Sidle, MD   azithromycin (ZITHROMAX) 250 MG tablet Take 2 tabs PO x 1 dose, then 1 tab PO QD x 4 days 6 tablet Elvina Sidle, MD     I have reviewed the PDMP during this encounter.   Elvina Sidle, MD 07/18/20 [phone]

## 2020-07-18 NOTE — ED Triage Notes (Addendum)
Pt c/o flu like sxs x 3 days. Started off vomiting. Yesterday developed worsening cough, productive with green mucous. Denies fever. Theraflu and nyquil prn. Was seen for similar illness in July. No known covid exposure. Has had both covid vaccinations.

## 2020-11-28 ENCOUNTER — Other Ambulatory Visit: Payer: Self-pay

## 2020-11-28 ENCOUNTER — Emergency Department
Admission: EM | Admit: 2020-11-28 | Discharge: 2020-11-28 | Disposition: A | Discharge status: Home / Self Care | Payer: Managed Care, Other (non HMO) | Source: Home / Self Care

## 2020-11-28 DIAGNOSIS — T148XXA Other injury of unspecified body region, initial encounter: Secondary | ICD-10-CM

## 2020-11-28 DIAGNOSIS — M545 Low back pain, unspecified: Secondary | ICD-10-CM | POA: Diagnosis not present

## 2020-11-28 DIAGNOSIS — R030 Elevated blood-pressure reading, without diagnosis of hypertension: Secondary | ICD-10-CM

## 2020-11-28 DIAGNOSIS — M7989 Other specified soft tissue disorders: Secondary | ICD-10-CM

## 2020-11-28 LAB — CBC WITH DIFFERENTIAL/PLATELET
Absolute Monocytes: 660 cells/uL (ref 200–950)
Basophils Absolute: 57 cells/uL (ref 0–200)
Basophils Relative: 0.8 %
Eosinophils Absolute: 28 cells/uL (ref 15–500)
Eosinophils Relative: 0.4 %
HCT: 46.7 % (ref 38.5–50.0)
Hemoglobin: 15.5 g/dL (ref 13.2–17.1)
Lymphs Abs: 1299 cells/uL (ref 850–3900)
MCH: 30.6 pg (ref 27.0–33.0)
MCHC: 33.2 g/dL (ref 32.0–36.0)
MCV: 92.1 fL (ref 80.0–100.0)
MPV: 10 fL (ref 7.5–12.5)
Monocytes Relative: 9.3 %
Neutro Abs: 5055 cells/uL (ref 1500–7800)
Neutrophils Relative %: 71.2 %
Platelets: 312 10*3/uL (ref 140–400)
RBC: 5.07 10*6/uL (ref 4.20–5.80)
RDW: 12.5 % (ref 11.0–15.0)
Total Lymphocyte: 18.3 %
WBC: 7.1 10*3/uL (ref 3.8–10.8)

## 2020-11-28 LAB — COMPLETE METABOLIC PANEL WITH GFR
AG Ratio: 2.1 (calc) (ref 1.0–2.5)
ALT: 25 U/L (ref 9–46)
AST: 25 U/L (ref 10–35)
Albumin: 4.4 g/dL (ref 3.6–5.1)
Alkaline phosphatase (APISO): 59 U/L (ref 35–144)
BUN: 23 mg/dL (ref 7–25)
CO2: 24 mmol/L (ref 20–32)
Calcium: 8.9 mg/dL (ref 8.6–10.3)
Chloride: 108 mmol/L (ref 98–110)
Creat: 0.93 mg/dL (ref 0.70–1.33)
GFR, Est African American: 107 mL/min/{1.73_m2} (ref >=60)
GFR, Est Non African American: 92 mL/min/{1.73_m2} (ref >=60)
Globulin: 2.1 g/dL (calc) (ref 1.9–3.7)
Glucose, Bld: 98 mg/dL (ref 65–99)
Potassium: 5 mmol/L (ref 3.5–5.3)
Sodium: 138 mmol/L (ref 135–146)
Total Bilirubin: 0.5 mg/dL (ref 0.2–1.2)
Total Protein: 6.5 g/dL (ref 6.1–8.1)

## 2020-11-28 MED ORDER — CYCLOBENZAPRINE HCL 10 MG PO TABS: 10.0000 mg | ORAL_TABLET | Freq: Two times a day (BID) | ORAL | 0 refills | Status: DC | PRN

## 2020-11-28 MED ORDER — TRAMADOL HCL 50 MG PO TABS: 50.0000 mg | ORAL_TABLET | Freq: Four times a day (QID) | ORAL | 0 refills | Status: DC | PRN

## 2020-11-28 MED ORDER — HYDROCHLOROTHIAZIDE 12.5 MG PO CAPS: 12.5000 mg | ORAL_CAPSULE | Freq: Every day | ORAL | 0 refills | Status: AC | PRN

## 2020-11-28 MED ORDER — NAPROXEN 500 MG PO TABS: 500.0000 mg | ORAL_TABLET | Freq: Two times a day (BID) | ORAL | 0 refills | Status: DC

## 2020-11-28 NOTE — Discharge Instructions (Addendum)
I have sent in naproxen for you to take twice a day as needed for back pain.  I have sent in tramadol for you to take for breakthrough pain, 1 tablet every 6 hours as needed  I have sent in flexeril for you to take twice a day as needed for muscle spasms. This medication can make you sleepy. Do not drive or operate heavy machinery with this medication.  I would also like you to monitor your blood pressure for the next 2 weeks.  If you are consistently over 140/90, follow-up in 2 weeks with this office or with primary care to possibly start blood pressure medication.  If not, continue to monitor for 4 weeks and then follow-up with primary care.  Have also sent in hydrochlorothiazide, and a small dose of diuretic to help with the swelling.  You may take 1 of these tablets as needed for swelling daily.  We have drawn some labs today, we will be in contact with any abnormal results.  Get in touch with your primary care, for physical and for health maintenance.  Go to the ER for chest pain, increased swelling, unrelenting pain, shortness of breath, other concerning symptoms

## 2020-11-28 NOTE — ED Triage Notes (Signed)
Patient presents to Urgent Care with complaints of mid/lower back pain since a few days ago after cutting the grass, worse this morning. Patient reports he feels like he has mild weakness in his upper body, worse when he lifts his arms (about a week). Also feels like his hands are swelling, concerned he may be retaining water.

## 2020-11-28 NOTE — ED Provider Notes (Signed)
[facility]    CSN: [id number] Arrival date & time: 11/28/20  0816      History   Chief Complaint Chief Complaint  Patient presents with  . Back Pain    HPI Antonio Calhoun is a 55 y.o. male.   Reports that he has been having back pain for the last few days.  Reports that he thinks that he pulled a muscle.  Reports working outside in the yard.  Reports that he is also been wearing a back brace that provides lumbar support, this has been helpful for his pain.  Has been taking ibuprofen with little relief.  Also reports swelling to his hands and feet recently.  Reports that he is recently seen cardiology and had a negative work-up.  Reports that his brother died of a heart attack recently.  Reports that he has had a history of hypertension, and was on blood pressure lowering medications over 10 years ago.  Reports that he has not had any issue really since then.  Reports muscle fatigue as well when he notices the swelling.  Also reports an unintentional weight gain of 40 pounds over the last year. Denies headaches, cough, nausea, vomiting, diarrhea, rash, fever, shortness of breath, chest pain, increased fatigue, diaphoresis, other symptoms.  ROS per HPI  The history is provided by the patient.  Back Pain   Past Medical History:  Diagnosis Date  . Asthma    childhood  . Seasonal allergies     Patient Active Problem List   Diagnosis Date Noted  . Gastrocnemius strain, left 12/23/2018  . Insomnia 01/09/2012  . GERD (gastroesophageal reflux disease) 01/09/2012  . Annual physical exam 10/10/2011  . Elevated blood pressure 10/10/2011  . Atelectasis 09/30/2011    Past Surgical History:  Procedure Laterality Date  . ETHMOIDECTOMY     age 69       Home Medications    Prior to Admission medications   Medication Sig Start Date End Date Taking? Authorizing Provider  cyclobenzaprine (FLEXERIL) 10 MG tablet Take 1 tablet (10 mg total) by mouth 2 (two) times daily as  needed for muscle spasms. 11/28/20  Yes Moshe Cipro, NP  hydrochlorothiazide (MICROZIDE) 12.5 MG capsule Take 1 capsule (12.5 mg total) by mouth daily as needed (for swelling). 11/28/20  Yes Moshe Cipro, NP  naproxen (NAPROSYN) 500 MG tablet Take 1 tablet (500 mg total) by mouth 2 (two) times daily. 11/28/20  Yes Moshe Cipro, NP  traMADol (ULTRAM) 50 MG tablet Take 1 tablet (50 mg total) by mouth every 6 (six) hours as needed. 11/28/20  Yes Moshe Cipro, NP  azithromycin (ZITHROMAX) 250 MG tablet Take 2 tabs PO x 1 dose, then 1 tab PO QD x 4 days 07/18/20   Elvina Sidle, MD  polyethylene glycol (MIRALAX / GLYCOLAX) packet Take 17 g by mouth daily. 06/22/16   Lurene Shadow, PA-C    Family History Family History  Problem Relation Age of Onset  . Alcohol abuse Father   . Heart attack Father        deceased age 67  . Arthritis Paternal Grandfather   . Stroke Paternal Grandfather   . COPD Mother   . Breast cancer Neg Hx   . Colon polyps Neg Hx   . Heart disease Neg Hx   . Colon cancer Neg Hx   . Hypertension Neg Hx   . Hyperlipidemia Neg Hx     Social History Social History   Tobacco Use  . Smoking  status: Former Games developer  . Smokeless tobacco: Never Used  . Tobacco comment: quit 2006 history of 1/2 ppd for 6 years  Substance Use Topics  . Alcohol use: Yes    Comment: rare once a month  . Drug use: No     Allergies   Patient has no known allergies.   Review of Systems Review of Systems  Musculoskeletal: Positive for back pain.     Physical Exam Triage Vital Signs ED Triage Vitals  Enc Vitals Group     BP 11/28/20 0830 (!) 145/103     Pulse Rate 11/28/20 0830 94     Resp 11/28/20 0830 16     Temp 11/28/20 0830 98.3 F (36.8 C)     Temp Source 11/28/20 0830 Oral     SpO2 11/28/20 0830 97 %     Weight 11/28/20 0827 202 lb 6.4 oz (91.8 kg)     Height 11/28/20 0827 5\' 11"  (1.803 m)     Head Circumference --      Peak Flow --      Pain  Score 11/28/20 0827 5     Pain Loc --      Pain Edu? --      Excl. in GC? --    No data found.  Updated Vital Signs BP (!) 145/103 (BP Location: Right Arm)   Pulse 94   Temp 98.3 F (36.8 C) (Oral)   Resp 16   Ht 5\' 11"  (1.803 m)   Wt 202 lb 6.4 oz (91.8 kg)   SpO2 97%   BMI 28.23 kg/m      Physical Exam Vitals and nursing note reviewed.  Constitutional:      General: He is not in acute distress.    Appearance: Normal appearance. He is well-developed. He is not ill-appearing, toxic-appearing or diaphoretic.  HENT:     Head: Normocephalic and atraumatic.     Nose: Nose normal.     Mouth/Throat:     Mouth: Mucous membranes are moist.     Pharynx: Oropharynx is clear.  Eyes:     Extraocular Movements: Extraocular movements intact.     Conjunctiva/sclera: Conjunctivae normal.     Pupils: Pupils are equal, round, and reactive to light.  Cardiovascular:     Rate and Rhythm: Normal rate and regular rhythm.     Pulses: Normal pulses.     Heart sounds: Normal heart sounds. No murmur heard.   Pulmonary:     Effort: Pulmonary effort is normal. No respiratory distress.     Breath sounds: Normal breath sounds. No stridor. No wheezing, rhonchi or rales.  Chest:     Chest wall: No tenderness.  Abdominal:     General: Bowel sounds are normal.     Palpations: Abdomen is soft.     Tenderness: There is no abdominal tenderness.  Musculoskeletal:        General: Swelling and tenderness present.     Cervical back: Normal range of motion and neck supple.     Comments: Right thoracic back paraspinous muscles.  Mild swelling noted to bilateral upper and lower extremities  Skin:    General: Skin is warm and dry.     Capillary Refill: Capillary refill takes less than 2 seconds.  Neurological:     General: No focal deficit present.     Mental Status: He is alert and oriented to person, place, and time.  Psychiatric:        Mood and Affect: Mood normal.  Behavior: Behavior  normal.        Thought Content: Thought content normal.      UC Treatments / Results  Labs (all labs ordered are listed, but only abnormal results are displayed) Labs Reviewed  COMPLETE METABOLIC PANEL WITH GFR  CBC WITH DIFFERENTIAL/PLATELET    EKG   Radiology No results found.  Procedures Procedures (including critical care time)  Medications Ordered in UC Medications - No data to display  Initial Impression / Assessment and Plan / UC Course  I have reviewed the triage vital signs and the nursing notes.  Pertinent labs & imaging results that were available during my care of the patient were reviewed by me and considered in my medical decision making (see chart for details).    Low back pain without sciatica Bilateral hand swelling Elevated blood pressure reading without diagnosis of hypertension Muscle strain  Prescribed naproxen for you to take twice a day as needed for back pain. Prescribed tramadol for you to take for breakthrough pain, 1 tablet every 6 hours as needed Prescribed flexeril for you to take twice a day as needed for muscle spasms. This medication can make you sleepy. Do not drive or operate heavy machinery with this medication. Monitor your blood pressure for the next 2 weeks.  If you are consistently over 140/90, follow-up in 2 weeks with this office or with primary care to possibly start blood pressure medication.  If not, continue to monitor for 4 weeks and then follow-up with primary care. Prescribed hydrochlorothiazide, and a small dose of diuretic to help with the swelling.  You may take 1 of these tablets as needed for swelling daily. Drink plenty of water with this medication CBC and CMP drawn today.  We will be in contact with abnormal results. Get in touch with your primary care, for physical and for health maintenance. Go to the ER for chest pain, increased swelling, unrelenting pain, shortness of breath, other concerning  symptoms     Final Clinical Impressions(s) / UC Diagnoses   Final diagnoses:  Acute bilateral low back pain without sciatica  Bilateral hand swelling  Elevated blood pressure reading without diagnosis of hypertension  Muscle strain     Discharge Instructions     I have sent in naproxen for you to take twice a day as needed for back pain.  I have sent in tramadol for you to take for breakthrough pain, 1 tablet every 6 hours as needed  I have sent in flexeril for you to take twice a day as needed for muscle spasms. This medication can make you sleepy. Do not drive or operate heavy machinery with this medication.  I would also like you to monitor your blood pressure for the next 2 weeks.  If you are consistently over 140/90, follow-up in 2 weeks with this office or with primary care to possibly start blood pressure medication.  If not, continue to monitor for 4 weeks and then follow-up with primary care.  Have also sent in hydrochlorothiazide, and a small dose of diuretic to help with the swelling.  You may take 1 of these tablets as needed for swelling daily.  We have drawn some labs today, we will be in contact with any abnormal results.  Get in touch with your primary care, for physical and for health maintenance.  Go to the ER for chest pain, increased swelling, unrelenting pain, shortness of breath, other concerning symptoms      ED Prescriptions  Medication Sig Dispense Auth. Provider   cyclobenzaprine (FLEXERIL) 10 MG tablet Take 1 tablet (10 mg total) by mouth 2 (two) times daily as needed for muscle spasms. 20 tablet Moshe Cipro, NP   traMADol (ULTRAM) 50 MG tablet Take 1 tablet (50 mg total) by mouth every 6 (six) hours as needed. 15 tablet Moshe Cipro, NP   naproxen (NAPROSYN) 500 MG tablet Take 1 tablet (500 mg total) by mouth 2 (two) times daily. 30 tablet Moshe Cipro, NP   hydrochlorothiazide (MICROZIDE) 12.5 MG capsule Take 1 capsule  (12.5 mg total) by mouth daily as needed (for swelling). 30 capsule Moshe Cipro, NP     I have reviewed the PDMP during this encounter.   Moshe Cipro, NP 11/28/20 [phone]

## 2021-03-27 ENCOUNTER — Other Ambulatory Visit: Payer: Self-pay

## 2021-03-27 ENCOUNTER — Emergency Department
Admission: EM | Admit: 2021-03-27 | Discharge: 2021-03-27 | Disposition: A | Discharge status: Home / Self Care | Payer: Managed Care, Other (non HMO) | Source: Home / Self Care

## 2021-03-27 DIAGNOSIS — K529 Noninfective gastroenteritis and colitis, unspecified: Secondary | ICD-10-CM

## 2021-03-27 DIAGNOSIS — R109 Unspecified abdominal pain: Secondary | ICD-10-CM

## 2021-03-27 LAB — POCT URINALYSIS DIP (MANUAL ENTRY)
Bilirubin, UA: NEGATIVE
Blood, UA: NEGATIVE
Glucose, UA: NEGATIVE mg/dL
Ketones, POC UA: NEGATIVE mg/dL
Leukocytes, UA: NEGATIVE
Nitrite, UA: NEGATIVE
Protein Ur, POC: NEGATIVE mg/dL
Spec Grav, UA: >=1.03 — AB (ref 1.010–1.025)
Urobilinogen, UA: 0.2 E.U./dL
pH, UA: 5.5 (ref 5.0–8.0)

## 2021-03-27 MED ORDER — METRONIDAZOLE 500 MG PO TABS: 500.0000 mg | ORAL_TABLET | Freq: Three times a day (TID) | ORAL | 0 refills | Status: AC

## 2021-03-27 MED ORDER — CIPROFLOXACIN HCL 500 MG PO TABS: 500.0000 mg | ORAL_TABLET | Freq: Two times a day (BID) | ORAL | 0 refills | Status: AC

## 2021-03-27 NOTE — ED Triage Notes (Signed)
Pt presents to Urgent Care with c/o LLQ abdominal pain and lower back pain for approx 6 days. Also reports fatigue and diarrhea; stool this morning was "bright green."

## 2021-03-27 NOTE — Discharge Instructions (Addendum)
Advised/instructed patient to take medication as directed with food to completion.  Encouraged patient to increase daily water intake while taking these medications (preferably 48 ounces plus per day).  Advised/instructed patient if abdominal pain worsens, and/or is accompanied with increased diarrhea or hematochezia or fever please go to nearest ED for immediate evaluation.

## 2021-03-27 NOTE — ED Provider Notes (Signed)
[name]    CSN: [id number] Arrival date & time: 03/27/21  0804      History   Chief Complaint Chief Complaint  Patient presents with   Abdominal Cramping   Back Pain   Diarrhea    HPI Antonio Calhoun is a 55 y.o. male.   HPI 55 year old male presents with diarrhea, abdominal cramping, and bilateral back pain for 6 days.  Patient denies recent travel or exposure to contaminated foods or beverages.  Past Medical History:  Diagnosis Date   Asthma    childhood   Seasonal allergies     Patient Active Problem List   Diagnosis Date Noted   Gastrocnemius strain, left 12/23/2018   Insomnia 01/09/2012   GERD (gastroesophageal reflux disease) 01/09/2012   Annual physical exam 10/10/2011   Elevated blood pressure 10/10/2011   Atelectasis 09/30/2011    Past Surgical History:  Procedure Laterality Date   ETHMOIDECTOMY     age 11       Home Medications    Prior to Admission medications   Medication Sig Start Date End Date Taking? Authorizing Provider  ciprofloxacin (CIPRO) 500 MG tablet Take 1 tablet (500 mg total) by mouth 2 (two) times daily for 7 days. 03/27/21 04/03/21 Yes Trevor Iha, FNP  esomeprazole (NEXIUM) 20 MG capsule Take 20 mg by mouth daily at 12 noon.   Yes [provider]  metroNIDAZOLE (FLAGYL) 500 MG tablet Take 1 tablet (500 mg total) by mouth 3 (three) times daily for 7 days. 03/27/21 04/03/21 Yes Trevor Iha, FNP  azithromycin (ZITHROMAX) 250 MG tablet Take 2 tabs PO x 1 dose, then 1 tab PO QD x 4 days 07/18/20   Elvina Sidle, MD  cyclobenzaprine (FLEXERIL) 10 MG tablet Take 1 tablet (10 mg total) by mouth 2 (two) times daily as needed for muscle spasms. 11/28/20   Moshe Cipro, NP  hydrochlorothiazide (MICROZIDE) 12.5 MG capsule Take 1 capsule (12.5 mg total) by mouth daily as needed (for swelling). 11/28/20   Moshe Cipro, NP  naproxen (NAPROSYN) 500 MG tablet Take 1 tablet (500 mg total) by mouth 2 (two)  times daily. 11/28/20   Moshe Cipro, NP  polyethylene glycol (MIRALAX / GLYCOLAX) packet Take 17 g by mouth daily. 06/22/16   Lurene Shadow, PA-C  traMADol (ULTRAM) 50 MG tablet Take 1 tablet (50 mg total) by mouth every 6 (six) hours as needed. 11/28/20   Moshe Cipro, NP    Family History Family History  Problem Relation Age of Onset   Alcohol abuse Father    Heart attack Father        deceased age 71   Arthritis Paternal Grandfather    Stroke Paternal Grandfather    COPD Mother    Breast cancer Neg Hx    Colon polyps Neg Hx    Heart disease Neg Hx    Colon cancer Neg Hx    Hypertension Neg Hx    Hyperlipidemia Neg Hx     Social History Social History   Tobacco Use   Smoking status: Former    Types: Cigarettes   Smokeless tobacco: Never   Tobacco comments:    quit 2006 history of 1/2 ppd for 6 years  Vaping Use   Vaping Use: Never used  Substance Use Topics   Alcohol use: Yes    Comment: rare once a month   Drug use: No     Allergies   Patient has no known allergies.   Review of Systems Review  of Systems  Gastrointestinal:  Positive for diarrhea.       Abdominal cramping x6 days  All other systems reviewed and are negative.   Physical Exam Triage Vital Signs ED Triage Vitals  Enc Vitals Group     BP 03/27/21 0822 (!) 137/97     Pulse Rate 03/27/21 0822 81     Resp 03/27/21 0822 18     Temp 03/27/21 0822 98.6 F (37 C)     Temp Source 03/27/21 0822 Oral     SpO2 03/27/21 0822 97 %     Weight 03/27/21 0817 190 lb (86.2 kg)     Height 03/27/21 0817 5\' 11"  (1.803 m)     Head Circumference --      Peak Flow --      Pain Score 03/27/21 0817 5     Pain Loc --      Pain Edu? --      Excl. in [place]? --    No data found.  Updated Vital Signs BP (!) 134/96 (BP Location: Right Arm)   Pulse 81   Temp 98.6 F (37 C) (Oral)   Resp 18   Ht 5\' 11"  (1.803 m)   Wt 190 lb (86.2 kg)   SpO2 97%   BMI 26.50 kg/m       Physical Exam Vitals  and nursing note reviewed.  Constitutional:      General: He is not in acute distress.    Appearance: Normal appearance. He is obese. He is not ill-appearing.  HENT:     Head: Normocephalic and atraumatic.     Mouth/Throat:     Mouth: Mucous membranes are moist.     Pharynx: Oropharynx is clear.  Eyes:     Extraocular Movements: Extraocular movements intact.     Conjunctiva/sclera: Conjunctivae normal.     Pupils: Pupils are equal, round, and reactive to light.  Cardiovascular:     Rate and Rhythm: Normal rate and regular rhythm.     Pulses: Normal pulses.     Heart sounds: Normal heart sounds.  Pulmonary:     Effort: Pulmonary effort is normal.     Breath sounds: Normal breath sounds. No wheezing, rhonchi or rales.  Abdominal:     General: Bowel sounds are normal. There is no distension.     Palpations: Abdomen is soft. There is no mass.     Tenderness: There is no abdominal tenderness. There is no right CVA tenderness, left CVA tenderness, guarding or rebound.     Hernia: No hernia is present.     Comments: Hypoactive bowel sounds x4, no hepatosplenomegaly  Musculoskeletal:        General: Normal range of motion.     Cervical back: Normal range of motion and neck supple. No tenderness.  Lymphadenopathy:     Cervical: No cervical adenopathy.  Skin:    General: Skin is warm and dry.  Neurological:     General: No focal deficit present.     Mental Status: He is alert and oriented to person, place, and time. Mental status is at baseline.  Psychiatric:        Mood and Affect: Mood normal.        Behavior: Behavior normal.        Thought Content: Thought content normal.     UC Treatments / Results  Labs (all labs ordered are listed, but only abnormal results are displayed) Labs Reviewed  POCT URINALYSIS DIP (MANUAL ENTRY) - Abnormal; Notable  for the following components:      Result Value   Spec Grav, UA >=1.030 (*)    All other components within normal limits     EKG   Radiology No results found.  Procedures Procedures (including critical care time)  Medications Ordered in UC Medications - No data to display  Initial Impression / Assessment and Plan / UC Course  I have reviewed the triage vital signs and the nursing notes.  Pertinent labs & imaging results that were available during my care of the patient were reviewed by me and considered in my medical decision making (see chart for details).    MDM: 1.  Abdominal cramping-symptoms suggest colitis/diverticulitis; 2.  Colitis-Rx'd Cipro and Flagyl. Advised/instructed patient to take medication as directed with food to completion.  Encouraged patient to increase daily water intake while taking these medications (preferably 48 ounces plus per day).  Advised/instructed patient if abdominal pain worsens, and/or is accompanied with increased diarrhea or hematochezia or fever please go to nearest ED for immediate evaluation.  Patient discharged home, hemodynamically stable. Final Clinical Impressions(s) / UC Diagnoses   Final diagnoses:  Colitis  Abdominal cramping     Discharge Instructions      Advised/instructed patient to take medication as directed with food to completion.  Encouraged patient to increase daily water intake while taking these medications (preferably 48 ounces plus per day).  Advised/instructed patient if abdominal pain worsens, and/or is accompanied with increased diarrhea or hematochezia or fever please go to nearest ED for immediate evaluation.     ED Prescriptions     Medication Sig Dispense Auth. Provider   ciprofloxacin (CIPRO) 500 MG tablet Take 1 tablet (500 mg total) by mouth 2 (two) times daily for 7 days. 14 tablet Trevor Iha, FNP   metroNIDAZOLE (FLAGYL) 500 MG tablet Take 1 tablet (500 mg total) by mouth 3 (three) times daily for 7 days. 21 tablet Trevor Iha, FNP      PDMP not reviewed this encounter.   Trevor Iha, FNP 03/27/21  [phone]

## 2022-07-08 ENCOUNTER — Other Ambulatory Visit: Payer: Self-pay

## 2022-07-08 ENCOUNTER — Ambulatory Visit
Admission: EM | Admit: 2022-07-08 | Discharge: 2022-07-08 | Disposition: A | Discharge status: Home / Self Care | Payer: Managed Care, Other (non HMO) | Attending: Family Medicine | Admitting: Family Medicine

## 2022-07-08 ENCOUNTER — Ambulatory Visit (INDEPENDENT_AMBULATORY_CARE_PROVIDER_SITE_OTHER): Payer: Managed Care, Other (non HMO)

## 2022-07-08 DIAGNOSIS — J0100 Acute maxillary sinusitis, unspecified: Secondary | ICD-10-CM

## 2022-07-08 DIAGNOSIS — J069 Acute upper respiratory infection, unspecified: Secondary | ICD-10-CM

## 2022-07-08 DIAGNOSIS — J029 Acute pharyngitis, unspecified: Secondary | ICD-10-CM | POA: Insufficient documentation

## 2022-07-08 DIAGNOSIS — Z1152 Encounter for screening for COVID-19: Secondary | ICD-10-CM | POA: Diagnosis not present

## 2022-07-08 DIAGNOSIS — J189 Pneumonia, unspecified organism: Secondary | ICD-10-CM | POA: Insufficient documentation

## 2022-07-08 LAB — RESP PANEL BY RT-PCR (FLU A&B, COVID) ARPGX2
Influenza A by PCR: NEGATIVE
Influenza B by PCR: NEGATIVE
SARS Coronavirus 2 by RT PCR: NEGATIVE

## 2022-07-08 MED ORDER — PREDNISONE 20 MG PO TABS: ORAL_TABLET | ORAL | 0 refills | Status: DC

## 2022-07-08 MED ORDER — CEFDINIR 300 MG PO CAPS: ORAL_CAPSULE | ORAL | 0 refills | Status: DC

## 2022-07-08 NOTE — ED Triage Notes (Signed)
Nasal congestion, headaches, cough onset for four days

## 2022-07-08 NOTE — Discharge Instructions (Addendum)
Take plain guaifenesin (1200mg  extended release tabs such as Mucinex) twice daily, with plenty of water, for cough and congestion.  May add Pseudoephedrine (30mg , one or two every 4 to 6 hours) for sinus congestion.  Get adequate rest.   May use Afrin nasal spray (or generic oxymetazoline) each morning for about 5 days and then discontinue.  Also recommend using saline nasal spray several times daily and saline nasal irrigation (AYR is a common brand).  Use Flonase nasal spray each morning after using Afrin nasal spray and saline nasal irrigation. Try warm salt water gargles for sore throat.  May take Delsym Cough Suppressant ("12 Hour Cough Relief") at bedtime for nighttime cough.    If your COVID-19 test is positive, isolate yourself for five days from today.  At the end of five days you may end isolation if your symptoms have cleared or improved, and you have not had a fever for 24 hours. At this time you should wear a mask for five more days when you are around others.   If symptoms become significantly worse during the night or over the weekend, proceed to the local emergency room.

## 2022-07-08 NOTE — ED Provider Notes (Signed)
[facility]    CSN: [id number] Arrival date & time: 07/08/22  0959      History   Chief Complaint Chief Complaint  Patient presents with   Nasal Congestion    HPI Antonio Calhoun is a 56 y.o. male.   HPI  Past Medical History:  Diagnosis Date   Asthma    childhood   Seasonal allergies     Patient Active Problem List   Diagnosis Date Noted   Gastrocnemius strain, left 12/23/2018   Insomnia 01/09/2012   GERD (gastroesophageal reflux disease) 01/09/2012   Annual physical exam 10/10/2011   Elevated blood pressure 10/10/2011   Atelectasis 09/30/2011    Past Surgical History:  Procedure Laterality Date   ETHMOIDECTOMY     age 27       Home Medications    Prior to Admission medications   Medication Sig Start Date End Date Taking? Authorizing Provider  cefdinir (OMNICEF) 300 MG capsule Take one cap PO Q12hr 07/08/22  Yes Shakura Cowing, Ishmael Holter, MD  predniSONE (DELTASONE) 20 MG tablet Take one tab by mouth twice daily for 4 days, then one daily. Take with food. 07/08/22  Yes Kandra Nicolas, MD  azithromycin (ZITHROMAX) 250 MG tablet Take 2 tabs PO x 1 dose, then 1 tab PO QD x 4 days 07/18/20   Robyn Haber, MD  cyclobenzaprine (FLEXERIL) 10 MG tablet Take 1 tablet (10 mg total) by mouth 2 (two) times daily as needed for muscle spasms. 11/28/20   Faustino Congress, NP  esomeprazole (NEXIUM) 20 MG capsule Take 20 mg by mouth daily at 12 noon.    [provider]  hydrochlorothiazide (MICROZIDE) 12.5 MG capsule Take 1 capsule (12.5 mg total) by mouth daily as needed (for swelling). 11/28/20   Faustino Congress, NP  naproxen (NAPROSYN) 500 MG tablet Take 1 tablet (500 mg total) by mouth 2 (two) times daily. 11/28/20   Faustino Congress, NP  polyethylene glycol (MIRALAX / GLYCOLAX) packet Take 17 g by mouth daily. 06/22/16   Noe Gens, PA-C  traMADol (ULTRAM) 50 MG tablet Take 1 tablet (50 mg total) by mouth every 6 (six) hours as needed. 11/28/20    Faustino Congress, NP    Family History Family History  Problem Relation Age of Onset   Alcohol abuse Father    Heart attack Father        deceased age 13   Arthritis Paternal Grandfather    Stroke Paternal Grandfather    COPD Mother    Breast cancer Neg Hx    Colon polyps Neg Hx    Heart disease Neg Hx    Colon cancer Neg Hx    Hypertension Neg Hx    Hyperlipidemia Neg Hx     Social History Social History   Tobacco Use   Smoking status: Former    Types: Cigarettes   Smokeless tobacco: Never   Tobacco comments:    quit 2006 history of 1/2 ppd for 6 years  Vaping Use   Vaping Use: Never used  Substance Use Topics   Alcohol use: Yes    Comment: rare once a month   Drug use: No     Allergies   Patient has no known allergies.   Review of Systems Review of Systems   Physical Exam Triage Vital Signs ED Triage Vitals  Enc Vitals Group     BP 07/08/22 1141 138/81     Pulse Rate 07/08/22 1141 86     Resp 07/08/22 1141  17     Temp 07/08/22 1141 98 F (36.7 C)     Temp Source 07/08/22 1141 Oral     SpO2 07/08/22 1141 94 %     Weight 07/08/22 1144 180 lb (81.6 kg)     Height --      Head Circumference --      Peak Flow --      Pain Score 07/08/22 1144 7     Pain Loc --      Pain Edu? --      Excl. in [place]? --    No data found.  Updated Vital Signs BP 138/81 (BP Location: Right Arm)   Pulse 86   Temp 98 F (36.7 C) (Oral)   Resp 17   Wt 81.6 kg   SpO2 94%   BMI 25.10 kg/m   Visual Acuity Right Eye Distance:   Left Eye Distance:   Bilateral Distance:    Right Eye Near:   Left Eye Near:    Bilateral Near:     Physical Exam   UC Treatments / Results  Labs (all labs ordered are listed, but only abnormal results are displayed) Labs Reviewed  RESP PANEL BY RT-PCR (FLU A&B, COVID) [id number]    EKG   Radiology DG Chest 2 View  Result Date: 07/08/2022 CLINICAL DATA:  Increased wheezing.  Viral URI for 5 days. EXAM: CHEST - 2 VIEW  COMPARISON:  July 18, 2020 FINDINGS: The heart size and mediastinal contours are within normal limits. Minimal opacity of left lung base is identified. There is no pulmonary edema or pleural effusion. The visualized skeletal structures are unremarkable. IMPRESSION: Minimal opacity of left lung base, developing pneumonia is not excluded. Electronically Signed   By: Abelardo Diesel M.D.   On: 07/08/2022 12:59    Procedures Procedures (including critical care time)  Medications Ordered in UC Medications - No data to display  Initial Impression / Assessment and Plan / UC Course  I have reviewed the triage vital signs and the nursing notes.  Pertinent labs & imaging results that were available during my care of the patient were reviewed by me and considered in my medical decision making (see chart for details).    Begin Omnicef and prednisone burst/taper. Followup with Family Doctor if not improved in 8 days. COVID and Flu PCR pending.  Final Clinical Impressions(s) / UC Diagnoses   Final diagnoses:  Viral URI with cough  Pneumonia of left lower lobe due to infectious organism  Acute maxillary sinusitis, recurrence not specified     Discharge Instructions      Take plain guaifenesin (1200mg  extended release tabs such as Mucinex) twice daily, with plenty of water, for cough and congestion.  May add Pseudoephedrine (30mg , one or two every 4 to 6 hours) for sinus congestion.  Get adequate rest.   May use Afrin nasal spray (or generic oxymetazoline) each morning for about 5 days and then discontinue.  Also recommend using saline nasal spray several times daily and saline nasal irrigation (AYR is a common brand).  Use Flonase nasal spray each morning after using Afrin nasal spray and saline nasal irrigation. Try warm salt water gargles for sore throat.  May take Delsym Cough Suppressant ("12 Hour Cough Relief") at bedtime for nighttime cough.    If your COVID-19 test is positive, isolate  yourself for five days from today.  At the end of five days you may end isolation if your symptoms have cleared or improved, and you  have not had a fever for 24 hours. At this time you should wear a mask for five more days when you are around others.   If symptoms become significantly worse during the night or over the weekend, proceed to the local emergency room.     ED Prescriptions     Medication Sig Dispense Auth. Provider   cefdinir (OMNICEF) 300 MG capsule Take one cap PO Q12hr 20 capsule Lattie Haw, MD   predniSONE (DELTASONE) 20 MG tablet Take one tab by mouth twice daily for 4 days, then one daily. Take with food. 12 tablet Lattie Haw, MD      PDMP not reviewed this encounter.

## 2023-09-23 ENCOUNTER — Ambulatory Visit (INDEPENDENT_AMBULATORY_CARE_PROVIDER_SITE_OTHER): Payer: Managed Care, Other (non HMO)

## 2023-09-23 ENCOUNTER — Ambulatory Visit
Admission: EM | Admit: 2023-09-23 | Discharge: 2023-09-23 | Disposition: A | Discharge status: Home / Self Care | Payer: Managed Care, Other (non HMO) | Attending: Family Medicine | Admitting: Family Medicine

## 2023-09-23 DIAGNOSIS — R069 Unspecified abnormalities of breathing: Secondary | ICD-10-CM | POA: Diagnosis not present

## 2023-09-23 DIAGNOSIS — R059 Cough, unspecified: Secondary | ICD-10-CM | POA: Diagnosis not present

## 2023-09-23 DIAGNOSIS — R0689 Other abnormalities of breathing: Secondary | ICD-10-CM

## 2023-09-23 DIAGNOSIS — R5383 Other fatigue: Secondary | ICD-10-CM

## 2023-09-23 DIAGNOSIS — J189 Pneumonia, unspecified organism: Secondary | ICD-10-CM

## 2023-09-23 MED ORDER — AZITHROMYCIN 250 MG PO TABS: 250.0000 mg | ORAL_TABLET | Freq: Every day | ORAL | 0 refills | Status: AC

## 2023-09-23 MED ORDER — AMOXICILLIN-POT CLAVULANATE 875-125 MG PO TABS: 1.0000 | ORAL_TABLET | Freq: Two times a day (BID) | ORAL | 0 refills | Status: AC

## 2023-09-23 MED ORDER — BENZONATATE 200 MG PO CAPS: 200.0000 mg | ORAL_CAPSULE | Freq: Three times a day (TID) | ORAL | 0 refills | Status: AC | PRN

## 2023-09-23 MED ORDER — HYDROCODONE BIT-HOMATROP MBR 5-1.5 MG/5ML PO SOLN: 5.0000 mL | Freq: Four times a day (QID) | ORAL | 0 refills | Status: AC | PRN

## 2023-09-23 NOTE — ED Triage Notes (Addendum)
Pt presents to uc with co of sob and chest congestion and cough for over a month. Pt reports he recently got a cat and thought it was related and pcp gave albuterol with minimal improvement and new onset of generalized body aches yesterday. Pt reports o2 monitor has been getting down to 85 and he has coughing fits that cause him to feel dizzy.

## 2023-09-23 NOTE — Discharge Instructions (Addendum)
Advised patient to take medications as directed with food to completion.  Advised patient to take Zithromax with first dose of Augmentin until complete.  Advised may take Tessalon capsules daily or as needed for cough.  Advised may use Hycodan for cough at night prior to sleep due to sedative effects.  Encouraged to increase daily water intake to 64 ounces per day while taking these medications.  Advised if symptoms worsen and/or unresolved please follow-up with your PCP or here for further evaluation.  Advised patient to repeat chest x-ray on or about October 21, 2023 to ensure left lower lobe pneumonia has resolved.

## 2023-09-23 NOTE — ED Provider Notes (Signed)
[facility]    CSN: [id number] Arrival date & time: 09/23/23  1010      History   Chief Complaint Chief Complaint  Patient presents with   chest congestion   Cough   Shortness of Breath    HPI [name] is a 58 y.o. male.   HPI 58 year old male presents with chest congestion, cough, shortness of breath for 1 month.  Patient reports recently getting cat and believes that cough chest congestion related to pet hair dander and was given albuterol by his PCP with minimal improvement.  Patient reports oxygen monitor got down to 85% causing him to feel dizzy.  PMH significant for GERD, insomnia, and elevated blood pressure.  Past Medical History:  Diagnosis Date   Asthma    childhood   Seasonal allergies     Patient Active Problem List   Diagnosis Date Noted   Gastrocnemius strain, left 12/23/2018   Insomnia 01/09/2012   GERD (gastroesophageal reflux disease) 01/09/2012   Annual physical exam 10/10/2011   Elevated blood pressure 10/10/2011   Atelectasis 09/30/2011    Past Surgical History:  Procedure Laterality Date   ETHMOIDECTOMY     age 88       Home Medications    Prior to Admission medications   Medication Sig Start Date End Date Taking? Authorizing Provider  amoxicillin-clavulanate (AUGMENTIN) 875-125 MG tablet Take 1 tablet by mouth 2 (two) times daily for 10 days. 09/23/23 10/03/23 Yes Trevor Iha, FNP  azithromycin (ZITHROMAX) 250 MG tablet Take 1 tablet (250 mg total) by mouth daily. Take first 2 tablets together, then 1 every day until finished. 09/23/23  Yes Trevor Iha, FNP  benzonatate (TESSALON) 200 MG capsule Take 1 capsule (200 mg total) by mouth 3 (three) times daily as needed for up to 7 days. 09/23/23 09/30/23 Yes Trevor Iha, FNP  esomeprazole (NEXIUM) 20 MG capsule Take 20 mg by mouth daily at 12 noon.    [provider]  hydrochlorothiazide (MICROZIDE) 12.5 MG capsule Take 1 capsule (12.5 mg total) by mouth daily as  needed (for swelling). 11/28/20   Moshe Cipro, FNP  HYDROcodone bit-homatropine (HYCODAN) 5-1.5 MG/5ML syrup Take 5 mLs by mouth every 6 (six) hours as needed for cough. 09/23/23  Yes Trevor Iha, FNP  polyethylene glycol (MIRALAX / GLYCOLAX) packet Take 17 g by mouth daily. 06/22/16   Lurene Shadow, PA-C    Family History Family History  Problem Relation Age of Onset   Alcohol abuse Father    Heart attack Father        deceased age 31   Arthritis Paternal Grandfather    Stroke Paternal Grandfather    COPD Mother    Breast cancer Neg Hx    Colon polyps Neg Hx    Heart disease Neg Hx    Colon cancer Neg Hx    Hypertension Neg Hx    Hyperlipidemia Neg Hx     Social History Social History   Tobacco Use   Smoking status: Former    Types: Cigarettes   Smokeless tobacco: Never   Tobacco comments:    quit 2006 history of 1/2 ppd for 6 years  Vaping Use   Vaping status: Never Used  Substance Use Topics   Alcohol use: Yes    Comment: rare once a month   Drug use: No     Allergies   Patient has no known allergies.   Review of Systems Review of Systems  Constitutional:  Positive for fatigue.  Respiratory:  Positive for cough and shortness of breath.   All other systems reviewed and are negative.    Physical Exam Triage Vital Signs ED Triage Vitals  Encounter Vitals Group     BP 09/23/23 1048 (!) 152/80     Systolic BP Percentile --      Diastolic BP Percentile --      Pulse Rate 09/23/23 1048 (!) 102     Resp 09/23/23 1048 16     Temp 09/23/23 1048 98.8 F (37.1 C)     Temp src --      SpO2 09/23/23 1048 91 %     Weight --      Height --      Head Circumference --      Peak Flow --      Pain Score 09/23/23 1047 4     Pain Loc --      Pain Education --      Exclude from Growth Chart --    No data found.  Updated Vital Signs BP (!) 152/80   Pulse (!) 102   Temp 98.8 F (37.1 C)   Resp 16   SpO2 91%     Physical Exam Vitals and  nursing note reviewed.  Constitutional:      Appearance: Normal appearance. He is normal weight. He is ill-appearing.  HENT:     Head: Normocephalic and atraumatic.     Right Ear: Tympanic membrane, ear canal and external ear normal.     Left Ear: Tympanic membrane, ear canal and external ear normal.     Nose: Nose normal.     Mouth/Throat:     Mouth: Mucous membranes are moist.     Pharynx: Oropharynx is clear.  Eyes:     Extraocular Movements: Extraocular movements intact.     Conjunctiva/sclera: Conjunctivae normal.     Pupils: Pupils are equal, round, and reactive to light.  Cardiovascular:     Rate and Rhythm: Normal rate and regular rhythm.     Pulses: Normal pulses.     Heart sounds: Normal heart sounds.  Pulmonary:     Effort: Pulmonary effort is normal.     Breath sounds: No wheezing, rhonchi or rales.     Comments: Diminished breath sounds noted throughout, frequent nonproductive cough on exam Musculoskeletal:     Cervical back: Normal range of motion and neck supple.  Neurological:     Mental Status: He is alert.      UC Treatments / Results  Labs (all labs ordered are listed, but only abnormal results are displayed) Labs Reviewed - No data to display  EKG   Radiology DG Chest 2 View Result Date: 09/23/2023 CLINICAL DATA:  Cough and fatigue for 1 month. Abnormal breath sounds. EXAM: CHEST - 2 VIEW COMPARISON:  07/08/2022 FINDINGS: The heart size and mediastinal contours are within normal limits. Both lungs are clear. The visualized skeletal structures are unremarkable. IMPRESSION: No active cardiopulmonary disease. Electronically Signed   By: Danae Orleans M.D.   On: 09/23/2023 13:56    Procedures Procedures (including critical care time)  Medications Ordered in UC Medications - No data to display  Initial Impression / Assessment and Plan / UC Course  I have reviewed the triage vital signs and the nursing notes.  Pertinent labs & imaging results that  were available during my care of the patient were reviewed by me and considered in my medical decision making (see chart for details).  MDM: 1.  Community-acquired pneumonia of left lower lobe of lung-Rx'd Zithromax (500 mg day 1 then 250 mg day 2-5), Rx'd Augmentin 875/125 mg tablet: Take 1 tablet twice daily x 10 days.  CXR revealed above. 2.  Cough, unspecified type-CXR revealed above, Rx'd Tessalon capsules 200 mg: Take 1 capsule 3 times daily, as needed for cough, Rx'd Hycodan 5-1.5 mg / 5 mL syrup: Take 5 mL by mouth every 6 hours as needed for cough. Advised patient to take medications as directed with food to completion.  Advised patient to take Zithromax with first dose of Augmentin until complete.  Advised may take Tessalon capsules daily or as needed for cough.  Advised may use Hycodan for cough at night prior to sleep due to sedative effects.  Encouraged to increase daily water intake to 64 ounces per day while taking these medications.  Advised if symptoms worsen and/or unresolved please follow-up with your PCP or here for further evaluation.  Advised patient to repeat chest x-ray on or about October 21, 2023 to ensure left lower lobe pneumonia has resolved.  Discharged home, hemodynamically stable.  Work note provided to patient prior to discharge. Final Clinical Impressions(s) / UC Diagnoses   Final diagnoses:  Cough, unspecified type  Adventitious breath sounds  Community acquired pneumonia of left lower lobe of lung     Discharge Instructions      Advised patient to take medications as directed with food to completion.  Advised patient to take Zithromax with first dose of Augmentin until complete.  Advised may take Tessalon capsules daily or as needed for cough.  Advised may use Hycodan for cough at night prior to sleep due to sedative effects.  Encouraged to increase daily water intake to 64 ounces per day while taking these medications.  Advised if symptoms worsen and/or  unresolved please follow-up with your PCP or here for further evaluation.  Advised patient to repeat chest x-ray on or about October 21, 2023 to ensure left lower lobe pneumonia has resolved.     ED Prescriptions     Medication Sig Dispense Auth. Provider   azithromycin (ZITHROMAX) 250 MG tablet Take 1 tablet (250 mg total) by mouth daily. Take first 2 tablets together, then 1 every day until finished. 6 tablet Trevor Iha, FNP   amoxicillin-clavulanate (AUGMENTIN) 875-125 MG tablet Take 1 tablet by mouth 2 (two) times daily for 10 days. 20 tablet Trevor Iha, FNP   benzonatate (TESSALON) 200 MG capsule Take 1 capsule (200 mg total) by mouth 3 (three) times daily as needed for up to 7 days. 40 capsule Trevor Iha, FNP   HYDROcodone bit-homatropine (HYCODAN) 5-1.5 MG/5ML syrup Take 5 mLs by mouth every 6 (six) hours as needed for cough. 120 mL Trevor Iha, FNP      I have reviewed the PDMP during this encounter.   Trevor Iha, FNP 09/23/23 1413

## 2023-10-23 ENCOUNTER — Telehealth: Payer: Self-pay | Admitting: Emergency Medicine

## 2023-10-23 NOTE — Telephone Encounter (Signed)
 Received a call from [employer] regarding patient's office visit on 09-23-2023.  Marlo wanting to verify patient's diagnosis for his medical leave request.  Advised that I am unable to disclose this information.  They would need to contact patient's PCP for any information regarding FMLA paperwork.  We do not fill out any paperwork.  Marlo voices understanding.
